# Patient Record
Sex: Male | Born: 2000 | Race: White | Hispanic: No | Marital: Single | Smoking: Former smoker
Health system: Southern US, Community
[De-identification: ages and names within clinical notes are randomized; demographics above are authoritative.]

## PROBLEM LIST (undated history)

## (undated) DIAGNOSIS — E669 Obesity, unspecified: Secondary | ICD-10-CM

## (undated) DIAGNOSIS — S42309A Unspecified fracture of shaft of humerus, unspecified arm, initial encounter for closed fracture: Secondary | ICD-10-CM

## (undated) HISTORY — PX: LUMBAR FUSION: SHX111

## (undated) HISTORY — PX: TYMPANOSTOMY TUBE PLACEMENT: SHX32

## (undated) HISTORY — PX: HEMILAMINOTOMY LUMBAR SPINE: SUR654

## (undated) HISTORY — PX: LUMBAR LAMINECTOMY: SHX95

---
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
## 2018-01-29 ENCOUNTER — Encounter (HOSPITAL_BASED_OUTPATIENT_CLINIC_OR_DEPARTMENT_OTHER): Payer: Self-pay | Admitting: *Deleted

## 2018-01-29 ENCOUNTER — Other Ambulatory Visit: Payer: Self-pay

## 2018-01-29 DIAGNOSIS — R21 Rash and other nonspecific skin eruption: Secondary | ICD-10-CM | POA: Insufficient documentation

## 2018-01-29 DIAGNOSIS — L500 Allergic urticaria: Secondary | ICD-10-CM | POA: Diagnosis not present

## 2018-01-29 DIAGNOSIS — T7840XA Allergy, unspecified, initial encounter: Secondary | ICD-10-CM | POA: Insufficient documentation

## 2018-01-29 DIAGNOSIS — K130 Diseases of lips: Secondary | ICD-10-CM | POA: Diagnosis not present

## 2018-01-29 DIAGNOSIS — R600 Localized edema: Secondary | ICD-10-CM | POA: Diagnosis not present

## 2018-01-29 DIAGNOSIS — F17200 Nicotine dependence, unspecified, uncomplicated: Secondary | ICD-10-CM | POA: Insufficient documentation

## 2018-01-29 NOTE — ED Triage Notes (Signed)
Upper lip has been swelling. Started after eating dinner. Slight swelling. No distress.

## 2018-01-30 ENCOUNTER — Emergency Department (HOSPITAL_BASED_OUTPATIENT_CLINIC_OR_DEPARTMENT_OTHER)
Admission: EM | Admit: 2018-01-30 | Discharge: 2018-01-30 | Disposition: A | Discharge status: Home / Self Care | Payer: BLUE CROSS/BLUE SHIELD | Attending: Emergency Medicine | Admitting: Emergency Medicine

## 2018-01-30 DIAGNOSIS — T7840XA Allergy, unspecified, initial encounter: Secondary | ICD-10-CM

## 2018-01-30 MED ORDER — PREDNISONE 10 MG PO TABS: 20.0000 mg | ORAL_TABLET | Freq: Two times a day (BID) | ORAL | 0 refills | Status: DC

## 2018-01-30 MED ORDER — PREDNISONE 20 MG PO TABS
40.0000 mg | ORAL_TABLET | Freq: Once | ORAL | Status: AC
  Administered 2018-01-30: 40 mg via ORAL
  Filled 2018-01-30: qty 2

## 2018-01-30 MED ORDER — DIPHENHYDRAMINE HCL 25 MG PO CAPS
25.0000 mg | ORAL_CAPSULE | Freq: Once | ORAL | Status: AC
  Administered 2018-01-30: 25 mg via ORAL
  Filled 2018-01-30: qty 1

## 2018-01-30 NOTE — Discharge Instructions (Addendum)
Prednisone as prescribed.  Benadryl 25 mg every six hours for the next three days.  Return to the ER for worsening swelling, difficulty breathing or swallowing, or other new and concerning symptoms.

## 2018-01-30 NOTE — ED Provider Notes (Signed)
[facility] EMERGENCY DEPARTMENT Provider Note   CSN: [id number] Arrival date & time: 01/29/18  2257     History   Chief Complaint Chief Complaint  Patient presents with  . Oral Swelling    HPI Jesse Knox is a 17 y.o. male.  Patient is a 17 year old male with no significant past medical history.  He presents today for evaluation of lip swelling.  After eating dinner this evening, he noticed that his left upper lip began to swell.  He denies any discomfort or any pain.  He denies having consumed any foods at dinner that he has not eaten in the past.  Regarding breathing or swallowing.  He also noticed a rash under his left arm near his axilla that is itchy.  The history is provided by the patient.    Past Medical History:  Diagnosis Date  . Humerus fracture   . Obese     There are no active problems to display for this patient.   Past Surgical History:  Procedure Laterality Date  . HEMILAMINOTOMY LUMBAR SPINE    . LUMBAR FUSION    . LUMBAR LAMINECTOMY    . TYMPANOSTOMY TUBE PLACEMENT          Home Medications    Prior to Admission medications   Medication Sig Start Date End Date Taking? Authorizing Provider  acetaminophen (TYLENOL) 500 MG tablet Take 1,000 mg by mouth every 6 (six) hours as needed. For leg pain    [provider]  cephALEXin (KEFLEX) 500 MG capsule Take 1 capsule (500 mg total) by mouth 4 (four) times daily. 07/26/12   Bonk, Harrington Challenger, MD  doxycycline (VIBRAMYCIN) 100 MG capsule Take 1 capsule (100 mg total) by mouth 2 (two) times daily. 07/26/12   Bonk, Harrington Challenger, MD  ibuprofen (ADVIL,MOTRIN) 100 MG/5ML suspension Take 300 mg by mouth every 6 (six) hours as needed. For pain    [provider]  naproxen sodium (ANAPROX) 220 MG tablet Take 220 mg by mouth 2 (two) times daily with a meal. For pain    [provider]  oseltamivir (TAMIFLU) 30 MG capsule Take 30 mg by mouth 2 (two) times daily.    [provider]  polyethylene glycol powder (GLYCOLAX/MIRALAX) powder Take 17 g by mouth daily as needed. For bowels    [provider]  Psyllium (METAMUCIL PO) Take 15 g by mouth daily as needed. For bowels    [provider]    Family History No family history on file.  Social History Social History   Tobacco Use  . Smoking status: Current Every Day Smoker  . Smokeless tobacco: Current User  Substance Use Topics  . Alcohol use: No  . Drug use: Never     Allergies   Patient has no known allergies.   Review of Systems Review of Systems  All other systems reviewed and are negative.    Physical Exam Updated Vital Signs BP (!) 119/59 (BP Location: Right Arm)   Pulse 62   Temp 98.1 F (36.7 C) (Oral)   Resp 20   Ht 6\' 1"  (1.854 m)   Wt 122.5 kg (270 lb)   SpO2 98%   BMI 35.62 kg/m   Physical Exam  Constitutional: He is oriented to person, place, and time. He appears well-developed and well-nourished. No distress.  HENT:  Head: Normocephalic and atraumatic.  Mouth/Throat: Oropharynx is clear and moist.  There is mild swelling of the left upper lip.  There is  no oral swelling and no stridor.  Neck: Normal range of motion. Neck supple.  Cardiovascular: Normal rate and regular rhythm. Exam reveals no friction rub.  No murmur heard. Pulmonary/Chest: Effort normal and breath sounds normal. No respiratory distress. He has no wheezes. He has no rales.  Abdominal: Soft. Bowel sounds are normal. He exhibits no distension. There is no tenderness.  Musculoskeletal: Normal range of motion. He exhibits no edema.  Neurological: He is alert and oriented to person, place, and time. Coordination normal.  Skin: Skin is warm and dry. He is not diaphoretic.  There is an urticarial rash under the left arm near the axilla.  Nursing note and vitals reviewed.    ED Treatments / Results  Labs (all labs ordered are listed, but only abnormal results are  displayed) Labs Reviewed - No data to display  EKG None  Radiology No results found.  Procedures Procedures (including critical care time)  Medications Ordered in ED Medications  predniSONE (DELTASONE) tablet 40 mg (has no administration in time range)  diphenhydrAMINE (BENADRYL) capsule 25 mg (has no administration in time range)     Initial Impression / Assessment and Plan / ED Course  I have reviewed the triage vital signs and the nursing notes.  Pertinent labs & imaging results that were available during my care of the patient were reviewed by me and considered in my medical decision making (see chart for details).  Patient with what appears to be an allergic reaction, the precipitating factor I am uncertain.  He does work doing Chief Executive Officer in a cemetery and I suspect he may have come into contact with something there.  Either way, he will be treated with prednisone, Benadryl, and follow-up as needed.  The patient's mother is a Marine scientist and will look after him this evening.  Final Clinical Impressions(s) / ED Diagnoses   Final diagnoses:  None    ED Discharge Orders    None       Veryl Speak, MD 01/30/18 [phone]

## 2018-01-30 NOTE — ED Notes (Signed)
Mom reports urticaria on BIL arms. NAD. No increase to swelling of upper lip. Meds given.

## 2018-04-12 ENCOUNTER — Emergency Department (HOSPITAL_COMMUNITY)
Admission: EM | Admit: 2018-04-12 | Discharge: 2018-04-13 | Disposition: A | Discharge status: Home / Self Care | Payer: BLUE CROSS/BLUE SHIELD | Attending: Emergency Medicine | Admitting: Emergency Medicine

## 2018-04-12 DIAGNOSIS — R0602 Shortness of breath: Secondary | ICD-10-CM | POA: Diagnosis not present

## 2018-04-12 DIAGNOSIS — R079 Chest pain, unspecified: Secondary | ICD-10-CM | POA: Diagnosis not present

## 2018-04-12 DIAGNOSIS — R091 Pleurisy: Secondary | ICD-10-CM

## 2018-04-12 DIAGNOSIS — F17200 Nicotine dependence, unspecified, uncomplicated: Secondary | ICD-10-CM | POA: Diagnosis not present

## 2018-04-12 DIAGNOSIS — R0789 Other chest pain: Secondary | ICD-10-CM | POA: Diagnosis not present

## 2018-04-13 ENCOUNTER — Other Ambulatory Visit: Payer: Self-pay

## 2018-04-13 ENCOUNTER — Emergency Department (HOSPITAL_COMMUNITY): Payer: BLUE CROSS/BLUE SHIELD

## 2018-04-13 DIAGNOSIS — R079 Chest pain, unspecified: Secondary | ICD-10-CM | POA: Diagnosis not present

## 2018-04-13 LAB — BASIC METABOLIC PANEL
ANION GAP: 9 (ref 5–15)
BUN: 19 mg/dL (ref 6–20)
CALCIUM: 9.7 mg/dL (ref 8.9–10.3)
CO2: 28 mmol/L (ref 22–32)
Chloride: 102 mmol/L (ref 98–111)
Creatinine, Ser: 1.13 mg/dL (ref 0.61–1.24)
Glucose, Bld: 122 mg/dL — ABNORMAL HIGH (ref 70–99)
POTASSIUM: 3.4 mmol/L — AB (ref 3.5–5.1)
SODIUM: 139 mmol/L (ref 135–145)

## 2018-04-13 LAB — CBC
HEMATOCRIT: 43.6 % (ref 39.0–52.0)
HEMOGLOBIN: 14.4 g/dL (ref 13.0–17.0)
MCH: 27.7 pg (ref 26.0–34.0)
MCHC: 33 g/dL (ref 30.0–36.0)
MCV: 83.8 fL (ref 78.0–100.0)
PLATELETS: 265 10*3/uL (ref 150–400)
RBC: 5.2 MIL/uL (ref 4.22–5.81)
RDW: 12.9 % (ref 11.5–15.5)
WBC: 9.2 10*3/uL (ref 4.0–10.5)

## 2018-04-13 LAB — I-STAT TROPONIN, ED: TROPONIN I, POC: 0 ng/mL (ref 0.00–0.08)

## 2018-04-13 LAB — D-DIMER, QUANTITATIVE: D-Dimer, Quant: <0.27 ug/mL-FEU (ref 0.00–0.50)

## 2018-04-13 NOTE — ED Notes (Signed)
As phlebotomist placed tourniquet on patient, he became pale and diaphoretic. After being stuck with needle patient then vomited and remained pale for several minutes.

## 2018-04-13 NOTE — ED Provider Notes (Signed)
[facility] EMERGENCY DEPARTMENT Provider Note   CSN: [id number] Arrival date & time: 04/12/18  2354     History   Chief Complaint Chief Complaint  Patient presents with  . Chest Pain    HPI Jesse Knox is a 17 y.o. male.  The history is provided by the patient and a parent.  Chest Pain   This is a new problem. The current episode started 2 days ago. The problem occurs constantly. The problem has not changed since onset.Pain location: Left chest. The pain is moderate. The quality of the pain is described as pleuritic and sharp. The pain does not radiate. Associated symptoms include shortness of breath. Pertinent negatives include no abdominal pain, no back pain, no fever, no hemoptysis, no lower extremity edema, no syncope and no weakness. He has tried nothing for the symptoms.  Pertinent negatives for past medical history include no CAD and no PE.  Pertinent negatives for family medical history include: no CAD.  Patient presents for chest pain.  He reports several days ago he began having neck pain, that progressed to shoulder pain.  He now reports all his pain is in his chest.  It is mostly with breathing and some movement.  He reports the pain is sharp He has no known history of CAD/PE.  He does do exertional activity with work, so he may have strained his chest.  Denies any recent fatigue or syncope.  He does report during  his blood drawn today in the ER he became nauseous and vomited  Past Medical History:  Diagnosis Date  . Humerus fracture   . Obese     There are no active problems to display for this patient.   Past Surgical History:  Procedure Laterality Date  . HEMILAMINOTOMY LUMBAR SPINE    . LUMBAR FUSION    . LUMBAR LAMINECTOMY    . TYMPANOSTOMY TUBE PLACEMENT          Home Medications    Prior to Admission medications   Medication Sig Start Date End Date Taking? Authorizing Provider  acetaminophen (TYLENOL) 500 MG tablet Take 1,000  mg by mouth every 6 (six) hours as needed. For leg pain    [provider]  Psyllium (METAMUCIL PO) Take 15 g by mouth daily as needed. For bowels    [provider]    Family History No family history on file.  Social History Social History   Tobacco Use  . Smoking status: Current Every Day Smoker  . Smokeless tobacco: Current User  Substance Use Topics  . Alcohol use: No  . Drug use: Never     Allergies   Patient has no known allergies.   Review of Systems Review of Systems  Constitutional: Negative for fatigue and fever.  Respiratory: Positive for shortness of breath. Negative for hemoptysis.   Cardiovascular: Positive for chest pain. Negative for syncope.  Gastrointestinal: Negative for abdominal pain.  Musculoskeletal: Negative for back pain.  Neurological: Negative for syncope and weakness.  All other systems reviewed and are negative.    Physical Exam Updated Vital Signs BP (!) 122/59   Pulse (!) 50   Temp 98.9 F (37.2 C) (Oral)   Resp (!) 21   Ht 1.829 m (6')   Wt 117.9 kg   SpO2 97%   BMI 35.26 kg/m   Physical Exam  CONSTITUTIONAL: Well developed/well nourished HEAD: Normocephalic/atraumatic EYES: EOMI/PERRL ENMT: Mucous membranes moist NECK: supple no meningeal signs SPINE/BACK:entire spine nontender CV:  S1/S2 noted, no murmurs/rubs/gallops noted LUNGS: Lungs are clear to auscultation bilaterally, no apparent distress Chest-no tenderness noted ABDOMEN: soft, nontender, no rebound or guarding, bowel sounds noted throughout abdomen GU:no cva tenderness NEURO: Pt is awake/alert/appropriate, moves all extremitiesx4.  No facial droop.   EXTREMITIES: pulses normal/equal, full ROM, no lower extremity edema or tenderness SKIN: warm, color normal PSYCH: no abnormalities of mood noted, alert and oriented to situation  ED Treatments / Results  Labs (all labs ordered are listed, but only abnormal results are displayed) Labs  Reviewed  BASIC METABOLIC PANEL - Abnormal; Notable for the following components:      Result Value   Potassium 3.4 (*)    Glucose, Bld 122 (*)    All other components within normal limits  CBC  D-DIMER, QUANTITATIVE (NOT AT [facility])  I-STAT TROPONIN, ED    EKG EKG Interpretation  Date/Time:  Friday April 13 2018 00:15:25 EDT Ventricular Rate:  54 PR Interval:  148 QRS Duration: 92 QT Interval:  404 QTC Calculation: 383 R Axis:   49 Text Interpretation:  Sinus bradycardia Otherwise normal ECG No previous ECGs available Confirmed by Ripley Fraise [phone]) on 04/13/2018 3:22:18 AM   Radiology Dg Chest 2 View  Result Date: 04/13/2018 CLINICAL DATA:  17 year old male with chest pain. EXAM: CHEST - 2 VIEW COMPARISON:  None FINDINGS: The heart size and mediastinal contours are within normal limits. Both lungs are clear. The visualized skeletal structures are unremarkable. IMPRESSION: No active cardiopulmonary disease. Electronically Signed   By: Anner Crete M.D.   On: 04/13/2018 00:52    Procedures Procedures Medications Ordered in ED Medications - No data to display   Initial Impression / Assessment and Plan / ED Course  I have reviewed the triage vital signs and the nursing notes.  Pertinent labs & imaging results that were available during my care of the patient were reviewed by me and considered in my medical decision making (see chart for details).     Patient presented with episodes of chest pain.  Low suspicion for ACS.  D-dimer was negative.  Chest x-ray was negative. Of note he did have episodes of bradycardia, and I reviewed on telemetry monitor.  HR ropped to 39-40 and immediately corrected  It always remained sinus.  He was asymptomatic during these episodes.  I have referred him to cardiology, but I do not feel this is an acute issue at this time.  Final Clinical Impressions(s) / ED Diagnoses   Final diagnoses:  Pleurisy    ED Discharge Orders    None         Ripley Fraise, MD 04/13/18 [phone]

## 2018-04-13 NOTE — ED Triage Notes (Addendum)
Patient c/o CP that started in his neck, then down to shoulders and then into his chest. This began 2 days ago. States that pain is worse with movement. 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

## 2018-04-13 NOTE — ED Notes (Signed)
Went to check on patient when xray completed; skin warm and dry and color normal. States that he feels better.

## 2018-04-18 ENCOUNTER — Other Ambulatory Visit (HOSPITAL_COMMUNITY): Payer: BLUE CROSS/BLUE SHIELD

## 2018-04-18 ENCOUNTER — Encounter: Payer: Self-pay | Admitting: Cardiology

## 2018-04-18 ENCOUNTER — Ambulatory Visit: Payer: BLUE CROSS/BLUE SHIELD | Admitting: Cardiology

## 2018-04-18 VITALS — BP 118/68 | HR 48 | Ht 72.0 in | Wt 264.0 lb

## 2018-04-18 DIAGNOSIS — E663 Overweight: Secondary | ICD-10-CM

## 2018-04-18 DIAGNOSIS — R001 Bradycardia, unspecified: Secondary | ICD-10-CM | POA: Diagnosis not present

## 2018-04-18 DIAGNOSIS — R079 Chest pain, unspecified: Secondary | ICD-10-CM | POA: Insufficient documentation

## 2018-04-18 DIAGNOSIS — F17210 Nicotine dependence, cigarettes, uncomplicated: Secondary | ICD-10-CM | POA: Insufficient documentation

## 2018-04-18 NOTE — Patient Instructions (Signed)
Medication Instructions:  Your physician recommends that you continue on your current medications as directed. Please refer to the Current Medication list given to you today.  Labwork: Your physician recommends that you have the following labs drawn: BMP, TSH, and Lipid panel.  Testing/Procedures: Your physician has requested that you have an echocardiogram. Echocardiography is a painless test that uses sound waves to create images of your heart. It provides your doctor with information about the size and shape of your heart and how well your heart's chambers and valves are working. This procedure takes approximately one hour. There are no restrictions for this procedure.  Your physician has recommended that you wear a holter monitor. Holter monitors are medical devices that record the heart's electrical activity. Doctors most often use these monitors to diagnose arrhythmias. Arrhythmias are problems with the speed or rhythm of the heartbeat. The monitor is a small, portable device. You can wear one while you do your normal daily activities. This is usually used to diagnose what is causing palpitations/syncope (passing out).  Your physician has requested that you have an exercise tolerance test. For further information please visit [email or web address]. Please also follow instruction sheet, as given.  Follow-Up: Your physician recommends that you schedule a follow-up appointment in: 6 months  Any Other Special Instructions Will Be Listed Below (If Applicable).     If you need a refill on your cardiac medications before your next appointment, please call your pharmacy.   [place], RN, BSN   Cardiopulmonary Exercise Stress Test Cardiopulmonary exercise testing (CPET) is a test that checks how your heart and lungs react to exercise. This is called your exercise capacity. During this test, you will walk or run on a treadmill or pedal on a stationary bike while tests are done  on your heart and lungs. You may have this test to:  See why you are short of breath.  Check for exercise intolerance.  See how your lungs work.  See how your heart works.  Check for how you are responding to a heart or lung rehabilitation program.  See if you have a heart or lung problem.  See if you are healthy enough to have surgery.  What happens before the procedure?  Follow instructions from your doctor about what you cannot eat or drink.  Ask your doctor about changing or stopping your normal medicines. This is important if you take diabetes medicines or blood thinners.  Wear loose, comfortable clothing and shoes.  If you use an inhaler, bring it with you to the test. What happens during the procedure?  A blood pressure cuff will be placed on your arm.  Several stick-on patches (electrodes) will be placed on your chest. They will be attached to an electrocardiogram (EKG) machine.  A clip-on monitor that measures the amount of oxygen in your blood will be placed on your finger (pulse oximeter).  A clip will be placed on your nose and a mouthpiece will be placed in your mouth. This may be held in place with a headpiece. You will breathe through the mouthpiece during the test.  You will be asked to start exercising. You will be closely watched while you exercise.  The amount of effort for your exercise will be gradually increased.  During exercise, the test will measure: ? Your heart rate. ? Your heart rhythm. ? Your oxygen blood level. ? The amount of oxygen and carbon dioxide that you breathe out.  The test will end when: ?  You have finished the test. ? You have reached your maximum ability to exercise. ? You have chest or leg pain, dizziness, or shortness of breath. The procedure may vary among doctors and hospitals. What happens after the procedure?  Your blood pressure and EKG will be checked to watch how you recover from the test. This information is not  intended to replace advice given to you by your health care provider. Make sure you discuss any questions you have with your health care provider. Document Released: 07/20/2009 Document Revised: 12/22/2015 Document Reviewed: 06/15/2015 Elsevier Interactive Patient Education  2018 [employer].  Holter Monitoring A Holter monitor is a small device that is used to detect abnormal heart rhythms. It clips to your clothing and is connected by wires to flat, sticky disks (electrodes) that attach to your chest. It is worn continuously for 24-48 hours. Follow these instructions at home:  Wear your Holter monitor at all times, even while exercising and sleeping, for as long as directed by your health care provider.  Make sure that the Holter monitor is safely clipped to your clothing or close to your body as recommended by your health care provider.  Do not get the monitor or wires wet.  Do not put body lotion or moisturizer on your chest.  Keep your skin clean.  Keep a diary of your daily activities, such as walking and doing chores. If you feel that your heartbeat is abnormal or that your heart is fluttering or skipping a beat: ? Record what you are doing when it happens. ? Record what time of day the symptoms occur.  Return your Holter monitor as directed by your health care provider.  Keep all follow-up visits as directed by your health care provider. This is important. Get help right away if:  You feel lightheaded or you faint.  You have trouble breathing.  You feel pain in your chest, upper arm, or jaw.  You feel sick to your stomach and your skin is pale, cool, or damp.  You heartbeat feels unusual or abnormal. This information is not intended to replace advice given to you by your health care provider. Make sure you discuss any questions you have with your health care provider. Document Released: 04/29/2004 Document Revised: 01/07/2016 Document Reviewed: 03/10/2014 Elsevier  Interactive Patient Education  2018 [employer]. Echocardiogram An echocardiogram, or echocardiography, uses sound waves (ultrasound) to produce an image of your heart. The echocardiogram is simple, painless, obtained within a short period of time, and offers valuable information to your health care provider. The images from an echocardiogram can provide information such as:  Evidence of coronary artery disease (CAD).  Heart size.  Heart muscle function.  Heart valve function.  Aneurysm detection.  Evidence of a past heart attack.  Fluid buildup around the heart.  Heart muscle thickening.  Assess heart valve function.  Tell a health care provider about:  Any allergies you have.  All medicines you are taking, including vitamins, herbs, eye drops, creams, and over-the-counter medicines.  Any problems you or family members have had with anesthetic medicines.  Any blood disorders you have.  Any surgeries you have had.  Any medical conditions you have.  Whether you are pregnant or may be pregnant. What happens before the procedure? No special preparation is needed. Eat and drink normally. What happens during the procedure?  In order to produce an image of your heart, gel will be applied to your chest and a wand-like tool (transducer) will be moved  over your chest. The gel will help transmit the sound waves from the transducer. The sound waves will harmlessly bounce off your heart to allow the heart images to be captured in real-time motion. These images will then be recorded.  You may need an IV to receive a medicine that improves the quality of the pictures. What happens after the procedure? You may return to your normal schedule including diet, activities, and medicines, unless your health care provider tells you otherwise. This information is not intended to replace advice given to you by your health care provider. Make sure you discuss any questions you have with your  health care provider. Document Released: 07/29/2000 Document Revised: 03/19/2016 Document Reviewed: 04/08/2013 Elsevier Interactive Patient Education  2017 [employer].

## 2018-04-18 NOTE — Progress Notes (Signed)
Cardiology Office Note:    Date:  04/18/2018   ID:  Jesse Knox, DOB 14-Dec-2000, MRN [id number]  PCP:  Patient, No Pcp Per  Cardiologist:  Jenean Lindau, MD   Referring MD: Ripley Fraise, MD    ASSESSMENT:    1. Chest pain, unspecified type   2. Bradycardia   3. Cigarette smoker   4. Overweight    PLAN:    In order of problems listed above:  1. I discussed my findings with the patient at extensive length and reassured him and his mother.  Questions were answered to their satisfaction.  I reviewed [place] hospital records extensively. 2. His blood pressure is stable.  His heart rate is on the lower side and I will do a TSH to evaluate this and a 48-hour Holter monitor.  I will also get him a treadmill stress test to assess his chest pain and this will also help me assess his chronotropic competence. 3. His mother requests and also the patient does want me to check his lipids for risk stratification and because of his poor diet and we will oblige.  He will also have a Chem-7 because he has had a Chem-7 recently with low potassium. 4. Echocardiogram will be done to assess murmur heard on auscultation. 5. Patient will be seen in follow-up appointment in 6 months or earlier if the patient has any concerns 6. He knows to go to the nearest emergency room for any concerning symptoms. 7. I spent 5 minutes with [name] discussing the risks of smoking. The patient vocalized understanding and plans to quit. I offered the patient, pharmacological interventions however the patient plans to initially try to quit by the patient's own efforts. Patient will get in touch with me if any pharmacological aid/prescriptions are felt necessary.   Medication Adjustments/Labs and Tests Ordered: Current medicines are reviewed at length with the patient today.  Concerns regarding medicines are outlined above.  No orders of the defined types were placed in this encounter.  No orders of the defined  types were placed in this encounter.    History of Present Illness:    Jesse Knox is a 17 y.o. male who is being seen today for the evaluation of chest pain at the request of Ripley Fraise, MD.  Patient is a pleasant 17 year old gentleman.  He is accompanied by his mother today.  Patient has history of multiple issues to his spine and 2 spinal surgeries.  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  The patient had an episode of chest tightness and did not feel well and went to the emergency room.  In the emergency room he was evaluated.  An EKG and blood work was unremarkable and he was sent here.  He mentions to me that subsequently has gone back to work without any symptoms.  His mother is a Marine scientist and very concerned about it the symptoms for obvious reasons.  At the time of my evaluation, the patient is alert awake oriented and in no distress.  He leads a very sedentary lifestyle.  Unfortunately he smokes on a regular basis.  Past Medical History:  Diagnosis Date  . Humerus fracture   . Obese     Past Surgical History:  Procedure Laterality Date  . HEMILAMINOTOMY LUMBAR SPINE    . LUMBAR FUSION    . LUMBAR LAMINECTOMY    . TYMPANOSTOMY TUBE PLACEMENT  Current Medications: No outpatient medications have been marked as taking for the 04/18/18 encounter (Office Visit) with Revankar, Reita Cliche, MD.     Allergies:   Patient has no known allergies.   Social History   Socioeconomic History  . Marital status: Single    Spouse name: Not on file  . Number of children: Not on file  . Years of education: Not on file  . Highest education level: Not on file  Occupational History  . Not on file  Social Needs  . Financial resource strain: Not on file  . Food insecurity:    Worry: Not on file    Inability: Not on file  . Transportation needs:    Medical: Not on file    Non-medical: Not on file  Tobacco Use  . Smoking status: Current  Every Day Smoker  . Smokeless tobacco: Current User  Substance and Sexual Activity  . Alcohol use: No  . Drug use: Never  . Sexual activity: Not on file  Lifestyle  . Physical activity:    Days per week: Not on file    Minutes per session: Not on file  . Stress: Not on file  Relationships  . Social connections:    Talks on phone: Not on file    Gets together: Not on file    Attends religious service: Not on file    Active member of club or organization: Not on file    Attends meetings of clubs or organizations: Not on file    Relationship status: Not on file  Other Topics Concern  . Not on file  Social History Narrative  . Not on file     Family History: The patient's family history includes Heart attack in his maternal uncle.  ROS:   Please see the history of present illness.    All other systems reviewed and are negative.  EKGs/Labs/Other Studies Reviewed:    The following studies were reviewed today: EKG reveals sinus rhythm and nonspecific ST-T changes.   Recent Labs: 04/13/2018: BUN 19; Creatinine, Ser 1.13; Hemoglobin 14.4; Platelets 265; Potassium 3.4; Sodium 139  Recent Lipid Panel No results found for: CHOL, TRIG, HDL, CHOLHDL, VLDL, LDLCALC, LDLDIRECT  Physical Exam:    VS:  BP 118/68 (BP Location: Right Arm, Patient Position: Sitting, Cuff Size: Normal)   Pulse (!) 48   Ht 6' (1.829 m)   Wt 264 lb (119.7 kg)   SpO2 97%   BMI 35.80 kg/m     Wt Readings from Last 3 Encounters:  04/18/18 264 lb (119.7 kg) (>99 %, Z= 2.62)*  04/13/18 260 lb (117.9 kg) (>99 %, Z= 2.56)*  01/29/18 270 lb (122.5 kg) (>99 %, Z= 2.70)*   * Growth percentiles are based on CDC (Boys, 2-20 Years) data.     GEN: Patient is in no acute distress HEENT: Normal NECK: No JVD; No carotid bruits LYMPHATICS: No lymphadenopathy CARDIAC: S1 S2 regular, 2/6 systolic murmur at the apex. RESPIRATORY:  Clear to auscultation without rales, wheezing or rhonchi  ABDOMEN: Soft,  non-tender, non-distended MUSCULOSKELETAL:  No edema; No deformity  SKIN: Warm and dry NEUROLOGIC:  Alert and oriented x 3 PSYCHIATRIC:  Normal affect    Signed, Jenean Lindau, MD  04/18/2018 9:44 AM    [place]

## 2018-04-19 LAB — BASIC METABOLIC PANEL
BUN/Creatinine Ratio: 15 (ref 9–20)
BUN: 19 mg/dL (ref 6–20)
CALCIUM: 9.4 mg/dL (ref 8.7–10.2)
CO2: 22 mmol/L (ref 20–29)
Chloride: 105 mmol/L (ref 96–106)
Creatinine, Ser: 1.25 mg/dL (ref 0.76–1.27)
GFR calc Af Amer: 96 mL/min/{1.73_m2} (ref >59)
GFR, EST NON AFRICAN AMERICAN: 83 mL/min/{1.73_m2} (ref >59)
GLUCOSE: 92 mg/dL (ref 65–99)
POTASSIUM: 4.1 mmol/L (ref 3.5–5.2)
SODIUM: 144 mmol/L (ref 134–144)

## 2018-04-19 LAB — LIPID PANEL
CHOLESTEROL TOTAL: 148 mg/dL (ref 100–169)
Chol/HDL Ratio: 4.1 ratio (ref 0.0–5.0)
HDL: 36 mg/dL — ABNORMAL LOW (ref >39)
LDL CALC: 97 mg/dL (ref 0–109)
TRIGLYCERIDES: 75 mg/dL (ref 0–89)
VLDL Cholesterol Cal: 15 mg/dL (ref 5–40)

## 2018-04-19 LAB — TSH: TSH: 2.46 u[IU]/mL (ref 0.450–4.500)

## 2018-04-24 ENCOUNTER — Ambulatory Visit (INDEPENDENT_AMBULATORY_CARE_PROVIDER_SITE_OTHER): Payer: BLUE CROSS/BLUE SHIELD

## 2018-04-24 ENCOUNTER — Ambulatory Visit (HOSPITAL_COMMUNITY): Payer: BLUE CROSS/BLUE SHIELD | Attending: Cardiology

## 2018-04-24 ENCOUNTER — Other Ambulatory Visit: Payer: Self-pay

## 2018-04-24 DIAGNOSIS — R079 Chest pain, unspecified: Secondary | ICD-10-CM

## 2018-04-24 DIAGNOSIS — F17210 Nicotine dependence, cigarettes, uncomplicated: Secondary | ICD-10-CM

## 2018-04-24 DIAGNOSIS — R001 Bradycardia, unspecified: Secondary | ICD-10-CM

## 2018-04-24 LAB — EXERCISE TOLERANCE TEST
CHL CUP MPHR: 201 {beats}/min
CHL CUP RESTING HR STRESS: 50 {beats}/min
CHL RATE OF PERCEIVED EXERTION: 16
Estimated workload: 12.2 METS
Exercise duration (min): 10 min
Exercise duration (sec): 6 s
Peak HR: 179 {beats}/min
Percent HR: 89 %

## 2018-05-04 ENCOUNTER — Telehealth: Payer: Self-pay | Admitting: Cardiology

## 2018-05-04 NOTE — Telephone Encounter (Signed)
Please call patients mother with results of the testing that was done at [place] .  They have been waiting a while.

## 2018-05-04 NOTE — Telephone Encounter (Signed)
Mother informed of results.

## 2018-05-25 ENCOUNTER — Telehealth: Payer: Self-pay

## 2018-05-25 NOTE — Telephone Encounter (Signed)
-----   Message from Jenean Lindau, MD sent at 05/25/2018  9:42 AM EDT ----- The results of the study is unremarkable. Please inform patient. I will discuss in detail at next appointment. Cc  primary care/referring physician Jenean Lindau, MD 05/25/2018 9:42 AM

## 2018-05-25 NOTE — Telephone Encounter (Signed)
Patient called and notified of test results. 

## 2018-12-22 DIAGNOSIS — Z03818 Encounter for observation for suspected exposure to other biological agents ruled out: Secondary | ICD-10-CM | POA: Diagnosis not present

## 2018-12-22 DIAGNOSIS — B349 Viral infection, unspecified: Secondary | ICD-10-CM | POA: Diagnosis not present

## 2019-01-24 ENCOUNTER — Emergency Department
Admission: EM | Admit: 2019-01-24 | Discharge: 2019-01-24 | Disposition: A | Discharge status: Home / Self Care | Payer: BC Managed Care – PPO | Attending: Emergency Medicine | Admitting: Emergency Medicine

## 2019-01-24 ENCOUNTER — Encounter: Payer: Self-pay | Admitting: Emergency Medicine

## 2019-01-24 ENCOUNTER — Other Ambulatory Visit: Payer: Self-pay

## 2019-01-24 DIAGNOSIS — Y929 Unspecified place or not applicable: Secondary | ICD-10-CM | POA: Diagnosis not present

## 2019-01-24 DIAGNOSIS — F909 Attention-deficit hyperactivity disorder, unspecified type: Secondary | ICD-10-CM | POA: Insufficient documentation

## 2019-01-24 DIAGNOSIS — S0990XA Unspecified injury of head, initial encounter: Secondary | ICD-10-CM

## 2019-01-24 DIAGNOSIS — Y998 Other external cause status: Secondary | ICD-10-CM | POA: Diagnosis not present

## 2019-01-24 DIAGNOSIS — F17200 Nicotine dependence, unspecified, uncomplicated: Secondary | ICD-10-CM | POA: Diagnosis not present

## 2019-01-24 DIAGNOSIS — W228XXA Striking against or struck by other objects, initial encounter: Secondary | ICD-10-CM | POA: Diagnosis not present

## 2019-01-24 DIAGNOSIS — S0101XA Laceration without foreign body of scalp, initial encounter: Secondary | ICD-10-CM

## 2019-01-24 DIAGNOSIS — Y9389 Activity, other specified: Secondary | ICD-10-CM | POA: Insufficient documentation

## 2019-01-24 NOTE — Discharge Instructions (Signed)
Return to the emergency department for any symptom of concern.  Keep the wound dry for the next 5 days.
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
## 2019-05-15 ENCOUNTER — Emergency Department (HOSPITAL_BASED_OUTPATIENT_CLINIC_OR_DEPARTMENT_OTHER)
Admission: EM | Admit: 2019-05-15 | Discharge: 2019-05-15 | Disposition: A | Discharge status: Home / Self Care | Payer: BC Managed Care – PPO | Attending: Emergency Medicine | Admitting: Emergency Medicine

## 2019-05-15 ENCOUNTER — Other Ambulatory Visit: Payer: Self-pay

## 2019-05-15 ENCOUNTER — Emergency Department (HOSPITAL_BASED_OUTPATIENT_CLINIC_OR_DEPARTMENT_OTHER): Payer: BC Managed Care – PPO

## 2019-05-15 ENCOUNTER — Encounter (HOSPITAL_BASED_OUTPATIENT_CLINIC_OR_DEPARTMENT_OTHER): Payer: Self-pay | Admitting: *Deleted

## 2019-05-15 DIAGNOSIS — Y9389 Activity, other specified: Secondary | ICD-10-CM | POA: Diagnosis not present

## 2019-05-15 DIAGNOSIS — Y92008 Other place in unspecified non-institutional (private) residence as the place of occurrence of the external cause: Secondary | ICD-10-CM | POA: Diagnosis not present

## 2019-05-15 DIAGNOSIS — M25562 Pain in left knee: Secondary | ICD-10-CM | POA: Diagnosis not present

## 2019-05-15 DIAGNOSIS — Y999 Unspecified external cause status: Secondary | ICD-10-CM | POA: Diagnosis not present

## 2019-05-15 DIAGNOSIS — S8992XA Unspecified injury of left lower leg, initial encounter: Secondary | ICD-10-CM | POA: Diagnosis not present

## 2019-05-15 DIAGNOSIS — F17290 Nicotine dependence, other tobacco product, uncomplicated: Secondary | ICD-10-CM | POA: Insufficient documentation

## 2019-05-15 DIAGNOSIS — L03311 Cellulitis of abdominal wall: Secondary | ICD-10-CM | POA: Insufficient documentation

## 2019-05-15 DIAGNOSIS — W08XXXA Fall from other furniture, initial encounter: Secondary | ICD-10-CM | POA: Diagnosis not present

## 2019-05-15 MED ORDER — CEPHALEXIN 500 MG PO CAPS: 500.0000 mg | ORAL_CAPSULE | Freq: Four times a day (QID) | ORAL | 0 refills | Status: AC

## 2019-05-15 MED FILL — CEPHALEXIN 500 MG CAPSULE: 500 | 7 days supply | Qty: 28 | Fill #0

## 2019-05-15 NOTE — Discharge Instructions (Signed)
Your history and exam today are consistent with a folliculitis versus cellulitis on her abdomen.  We did not find evidence of an abscess.  Please take the antibiotic she will treat this and keep the area clean and dry.  For the knee, please use the knee brace and crutches and follow-up with orthopedics.  If any symptoms change or worsen, please return to the nearest emergency department.

## 2019-05-15 NOTE — ED Notes (Signed)
ED Provider at bedside. 

## 2019-05-15 NOTE — ED Notes (Signed)
Patient refused crutches.

## 2019-05-15 NOTE — ED Provider Notes (Signed)
[facility] Provider Note   CSN: [id number] Arrival date & time: 05/15/19  [id number]     History   Chief Complaint Chief Complaint  Patient presents with  . Abdominal, knee pain    HPI Jesse Knox is a 18 y.o. male.     The history is provided by the patient, a parent and medical records. No language interpreter was used.  Rash Location:  Torso Torso rash location: central lower abd. Quality: painful   Severity:  Mild Onset quality:  Gradual Timing:  Constant Progression:  Unchanged Chronicity:  Recurrent Relieved by:  Nothing Worsened by:  Nothing Ineffective treatments:  None tried Associated symptoms: no abdominal pain, no diarrhea, no fatigue, no fever, no headaches, no nausea, no shortness of breath, no sore throat, not vomiting and not wheezing   Knee Pain Location:  Knee Time since incident:  2 weeks Injury: yes   Mechanism of injury: fall   Knee location:  L knee Pain details:    Quality:  Aching   Radiates to:  Does not radiate Chronicity:  New Prior injury to area:  No Relieved by:  Nothing Associated symptoms: muscle weakness, numbness and tingling   Associated symptoms: no back pain, no fatigue, no fever and no neck pain   Associated symptoms comment:  Intermittently   Past Medical History:  Diagnosis Date  . Humerus fracture   . Obese     Patient Active Problem List   Diagnosis Date Noted  . Chest pain 04/18/2018  . Bradycardia 04/18/2018  . Cigarette smoker 04/18/2018  . 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 Closed supracondylar fracture of humerus 07/04/2012  . 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 Low back pain 02/29/2012  . Right leg pain 02/29/2012  . 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 Fracture of humerus 07/22/2011  . Attention deficit hyperactivity disorder (ADHD) 06/09/2009  . Encopresis 06/09/2009  . Overweight  07/22/2008    Past Surgical History:  Procedure Laterality Date  . HEMILAMINOTOMY LUMBAR SPINE    . LUMBAR FUSION    . LUMBAR LAMINECTOMY    . TYMPANOSTOMY TUBE PLACEMENT          Home Medications    Prior to Admission medications   Not on File    Family History Family History  Problem Relation Age of Onset  . Heart attack Maternal Uncle     Social History Social History   Tobacco Use  . Smoking status: Current Every Day Smoker  . Smokeless tobacco: Current User  Substance Use Topics  . Alcohol use: No  . Drug use: Never     Allergies   Patient has no known allergies.   Review of Systems Review of Systems  Constitutional: Negative for appetite change, chills, diaphoresis, fatigue and fever.  HENT: Negative for congestion and sore throat.   Respiratory: Negative for cough, chest tightness, shortness of breath and wheezing.   Cardiovascular: Negative for chest pain, palpitations and leg swelling.  Gastrointestinal: Negative for abdominal pain, constipation, diarrhea, nausea and vomiting.  Genitourinary: Negative for flank pain and frequency.  Musculoskeletal: Negative for back pain, neck pain and neck stiffness.  Skin: Positive for rash. Negative for wound.  Neurological: Negative for light-headedness and headaches.  Psychiatric/Behavioral: Negative for agitation.  All other systems reviewed and are negative.    Physical Exam Updated Vital Signs BP 125/65 (BP Location: Right Arm)  Pulse (!) 50   Temp 98 F (36.7 C) (Oral)   Resp 18   Ht 6\' 1"  (1.854 m)   Wt 120.2 kg   SpO2 98%   BMI 34.96 kg/m   Physical Exam Vitals signs and nursing note reviewed.  Constitutional:      General: He is not in acute distress.    Appearance: He is well-developed. He is not ill-appearing, toxic-appearing or diaphoretic.  HENT:     Head: Normocephalic and atraumatic.     Nose: Nose normal.  Eyes:     Conjunctiva/sclera: Conjunctivae normal.     Pupils: Pupils  are equal, round, and reactive to light.  Neck:     Musculoskeletal: Neck supple. No muscular tenderness.  Cardiovascular:     Rate and Rhythm: Normal rate and regular rhythm.     Pulses: Normal pulses.     Heart sounds: No murmur.  Pulmonary:     Effort: Pulmonary effort is normal. No respiratory distress.     Breath sounds: Normal breath sounds.  Abdominal:     General: Abdomen is flat. There is no distension.     Palpations: Abdomen is soft.     Tenderness: There is no abdominal tenderness. There is no right CVA tenderness, left CVA tenderness or guarding.     Hernia: No hernia is present.    Musculoskeletal:     Left knee: He exhibits no swelling and no effusion. Tenderness found.     Right lower leg: No edema.     Left lower leg: No edema.       Legs:  Skin:    General: Skin is warm and dry.     Capillary Refill: Capillary refill takes less than 2 seconds.     Findings: Erythema present. No rash.  Neurological:     General: No focal deficit present.     Mental Status: He is alert.     Sensory: No sensory deficit.     Motor: No weakness.  Psychiatric:        Mood and Affect: Mood normal.      ED Treatments / Results  Labs (all labs ordered are listed, but only abnormal results are displayed) Labs Reviewed - No data to display  EKG None  Radiology Dg Knee Complete 4 Views Left  Result Date: 05/15/2019 CLINICAL DATA:  Fall left knee pain. EXAM: LEFT KNEE - COMPLETE 4+ VIEW COMPARISON:  None. FINDINGS: No evidence of fracture, dislocation, or joint effusion. No evidence of arthropathy or other focal bone abnormality. Soft tissues are unremarkable. IMPRESSION: Negative. Electronically Signed   By: Zetta Bills M.D.   On: 05/15/2019 11:50    Procedures Procedures (including critical care time)  Medications Ordered in ED Medications - No data to display   Initial Impression / Assessment and Plan / ED Course  I have reviewed the triage vital signs and the  nursing notes.  Pertinent labs & imaging results that were available during my care of the patient were reviewed by me and considered in my medical decision making (see chart for details).        Jesse Knox is a 18 y.o. male with a past medical history significant for prior humerus fracture and bone grafting, ADHD, and prior lumbar laminectomy who presents with left knee pain with occasional numbness and weakness, and abdominal bump.  Patient reports that for the last 2 weeks he has had pain in his left knee with occasional weakness and numbness distal to  the knee.  He reports that he fell asleep on the couch with his legs crossed and then waking up, he stood up and had pain causing him to have a fall.  He reports he has had intermittent numbness and weakness from the knee down but is nothing constant.  He reports the pain is moderate and constant.  He has no history of knee injury, knee fracture, or ligamentous injury in the past.  He denies numbness or weakness at this time.  He reports no low back pain at this time.  No chest pain, upper extremity pains, headache, or neck pain.  He reports that he is having some redness and a small bump on his belt line in the middle.  He denies history folliculitis or hernias.  On exam, patient's abdomen is diffusely nontender.  At the patient's pubic hair line, there is evidence of folliculitis in 2 areas with 1 small not below the surface.  No evidence of large abscess.  It is mobile.  Differentials lymph node versus small folliculitis.  Do not feel this is a hernia and it is far away from the umbilicus.  It is not pulsatile.  Minimal tenderness.  Groin otherwise had no hernias and no lymph nodes.  Leg exam had tenderness in the left knee and left posterior knee.  Normal sensation, strength, and pulses.  No hip tenderness.  Had a shared decision made conversation with patient and mother.  Clinically aspect patient injured his left knee when he stood up and had  his fall after it fell asleep from his nap.  Also discussed the possibility of a Saturday night type palsy pathology in the leg although this was felt to be less likely.  Compressive nerve palsy would likely be more constant and less intermittent.  We agreed to get x-ray of the knee and placed into a knee immobilizer for orthopedic follow-up.  We agreed to provide antibiotics for the possible cellulitis versus folliculitis of the abdomen.  Anticipate discharge after x-ray.  12:12 PM X-rays reassuring.  We discussed using the brace and crutches and following with orthopedics for further evaluation management of possible ligamentous injury versus nerve injury.  We also will prescribe prescription for Keflex for likely cellulitis versus folliculitis on the abdomen.  Patient encouraged to keep area clean and watch for worsening infection.  Patient and family agree with plan of care and patient discharged in good condition.     Final Clinical Impressions(s) / ED Diagnoses   Final diagnoses:  Injury of left knee, initial encounter  Cellulitis of abdominal wall    ED Discharge Orders         Ordered    cephALEXin (KEFLEX) 500 MG capsule  4 times daily     05/15/19 1214          Clinical Impression: 1. Injury of left knee, initial encounter   2. Cellulitis of abdominal wall     Disposition: Discharge  Condition: Good  I have discussed the results, Dx and Tx plan with the pt(& family if present). He/she/they expressed understanding and agree(s) with the plan. Discharge instructions discussed at great length. Strict return precautions discussed and pt &/or family have verbalized understanding of the instructions. No further questions at time of discharge.    New Prescriptions   CEPHALEXIN (KEFLEX) 500 MG CAPSULE    Take 1 capsule (500 mg total) by mouth 4 (four) times daily for 7 days.    Follow Up: Cornell Barman, MD [address] [place] [address]  [facility] [address] [id number] [place] [phone]    Hiram Gash, MD [address] [place] [address] [phone]        Tegeler, Gwenyth Allegra, MD 05/15/19 1215

## 2019-05-15 NOTE — ED Triage Notes (Signed)
Patient noted a marble size "mass" under his belly button last night.  Unable to straighten his left leg for 2 weeks.

## 2019-06-06 ENCOUNTER — Ambulatory Visit: Payer: BC Managed Care – PPO | Admitting: Family Medicine

## 2019-06-21 ENCOUNTER — Encounter: Payer: Self-pay | Admitting: Family Medicine

## 2019-06-21 ENCOUNTER — Ambulatory Visit: Payer: BC Managed Care – PPO | Admitting: Family Medicine

## 2019-06-21 ENCOUNTER — Other Ambulatory Visit: Payer: Self-pay

## 2019-06-21 VITALS — BP 122/84 | HR 94 | Temp 97.7°F | Ht 70.0 in | Wt 265.0 lb

## 2019-06-21 DIAGNOSIS — Z6838 Body mass index (BMI) 38.0-38.9, adult: Secondary | ICD-10-CM | POA: Diagnosis not present

## 2019-06-21 DIAGNOSIS — E6609 Other obesity due to excess calories: Secondary | ICD-10-CM | POA: Diagnosis not present

## 2019-06-21 DIAGNOSIS — Z7689 Persons encountering health services in other specified circumstances: Secondary | ICD-10-CM

## 2019-06-21 LAB — HEMOGLOBIN A1C: Hgb A1c MFr Bld: 5.4 % (ref 4.6–6.5)

## 2019-06-21 LAB — COMPREHENSIVE METABOLIC PANEL
ALT: 25 U/L (ref 0–53)
AST: 21 U/L (ref 0–37)
Albumin: 4.6 g/dL (ref 3.5–5.2)
Alkaline Phosphatase: 58 U/L (ref 39–117)
BUN: 21 mg/dL (ref 6–23)
CO2: 27 mEq/L (ref 19–32)
Calcium: 9.9 mg/dL (ref 8.4–10.5)
Chloride: 103 mEq/L (ref 96–112)
Creatinine, Ser: 1.09 mg/dL (ref 0.40–1.50)
GFR: 85.79 mL/min (ref >60.00)
Glucose, Bld: 89 mg/dL (ref 70–99)
Potassium: 3.8 mEq/L (ref 3.5–5.1)
Sodium: 141 mEq/L (ref 135–145)
Total Bilirubin: 0.8 mg/dL (ref 0.2–1.2)
Total Protein: 7.3 g/dL (ref 6.0–8.3)

## 2019-06-21 LAB — LIPID PANEL
Cholesterol: 195 mg/dL (ref 0–200)
HDL: 41.7 mg/dL (ref >39.00)
NonHDL: 153.04
Total CHOL/HDL Ratio: 5
Triglycerides: 216 mg/dL — ABNORMAL HIGH (ref 0.0–149.0)
VLDL: 43.2 mg/dL — ABNORMAL HIGH (ref 0.0–40.0)

## 2019-06-21 LAB — LDL CHOLESTEROL, DIRECT: Direct LDL: 127 mg/dL

## 2019-06-21 NOTE — Progress Notes (Signed)
   Subjective:    Patient ID: Jesse Knox, male    DOB: 2000-11-02, 18 y.o.   MRN: [id number]  HPI This is a 18 yo male who presents today to establish care. Currently unemployed, looking for work. Lives with his mother. Enjoys fishing, hunting.   Last CPE- many years Tdap- 03/31/2010 Flu- declines Dental- 2 months ago Eye- never Exercise- no regular Diet- eats once a day, usually fast food. Soda 2-3 times a week, sweet tea daily. Eats a variety of foods.  Sex- male, 1 lifetime partner, declines std testing  ROS- no headaches, visual changes, cough, SOB, abdominal pain, diarrhea/constipation, muscle or joint pain.     Past Medical History:  Diagnosis Date  . Humerus fracture   . Obese    Past Surgical History:  Procedure Laterality Date  . HEMILAMINOTOMY LUMBAR SPINE    . LUMBAR FUSION    . LUMBAR LAMINECTOMY    . TYMPANOSTOMY TUBE PLACEMENT     Family History  Problem Relation Age of Onset  . Heart attack Maternal Uncle   . Breast cancer Maternal Grandmother    Social History   Tobacco Use  . Smoking status: Never Smoker  . Smokeless tobacco: Current User  Substance Use Topics  . Alcohol use: No  . Drug use: Never      Review of Systems Per HPI    Objective:   Physical Exam Physical Exam  Constitutional: Oriented to person, place, and time. He appears well-developed and well-nourished.  HENT:  Head: Normocephalic and atraumatic.  Eyes: Conjunctivae are normal.  Neck: Normal range of motion. Neck supple.  Cardiovascular: Normal rate, regular rhythm and normal heart sounds.   Pulmonary/Chest: Effort normal and breath sounds normal.  Musculoskeletal: Normal range of motion.  Neurological: Alert and oriented to person, place, and time.  Skin: Skin is warm and dry.  Psychiatric: Normal mood and affect. Behavior is normal. Judgment and thought content normal.  Vitals reviewed.   BP 122/84   Pulse 94   Temp 97.7 F (36.5 C) (Temporal)   Ht 5\' 10"   (1.778 m)   Wt 265 lb (120.2 kg)   SpO2 97%   BMI 38.02 kg/m  Wt Readings from Last 3 Encounters:  06/21/19 265 lb (120.2 kg)  05/15/19 265 lb (120.2 kg)  01/24/19 259 lb (117.5 kg)   Depression screen PHQ 2/9 06/21/2019  Decreased Interest 0  Down, Depressed, Hopeless 0  PHQ - 2 Score 0  Altered sleeping 0  Tired, decreased energy 0  Change in appetite 0  Feeling bad or failure about yourself  0  Trouble concentrating 0  Moving slowly or fidgety/restless 0  Suicidal thoughts 0  PHQ-9 Score 0       Assessment & Plan:  1. Encounter to establish care - reviewed EMR, immunizations. Offered flu, HPV- patient declined  2. Class 2 obesity due to excess calories without serious comorbidity with body mass index (BMI) of 38.0 to 38.9 in adult - discussed diet and encouraged decreased consumption of sugary beverages and fast food, encouraged increased vegetables, lean proteins - Lipid Panel - Comprehensive metabolic panel - Hemoglobin A1c  - follow up in 1 year   Clarene Reamer, FNP-BC  [facility]  06/21/2019 10:35 AM

## 2019-06-21 NOTE — Patient Instructions (Signed)
Good to meet you today  Return in 1 year for your complete physical  Consider getting HPV vaccination- you can just call office and schedule a nurse visit. It is a 3 shot series to protect against the HPV virus.   Continue to decrease soda and sweet tea, fast foods. Try to eat more vegetables, lean proteins, healthy starches- brown rice, sweet potatoes, beans.   I will notify you of lab results next week

## 2019-06-28 ENCOUNTER — Encounter (HOSPITAL_BASED_OUTPATIENT_CLINIC_OR_DEPARTMENT_OTHER): Payer: Self-pay | Admitting: *Deleted

## 2019-06-28 ENCOUNTER — Emergency Department (HOSPITAL_BASED_OUTPATIENT_CLINIC_OR_DEPARTMENT_OTHER)
Admission: EM | Admit: 2019-06-28 | Discharge: 2019-06-28 | Disposition: A | Discharge status: Home / Self Care | Payer: BC Managed Care – PPO | Attending: Emergency Medicine | Admitting: Emergency Medicine

## 2019-06-28 ENCOUNTER — Emergency Department (HOSPITAL_BASED_OUTPATIENT_CLINIC_OR_DEPARTMENT_OTHER): Payer: BC Managed Care – PPO

## 2019-06-28 ENCOUNTER — Other Ambulatory Visit: Payer: Self-pay

## 2019-06-28 DIAGNOSIS — N433 Hydrocele, unspecified: Secondary | ICD-10-CM | POA: Diagnosis not present

## 2019-06-28 DIAGNOSIS — N50812 Left testicular pain: Secondary | ICD-10-CM | POA: Diagnosis not present

## 2019-06-28 DIAGNOSIS — F17290 Nicotine dependence, other tobacco product, uncomplicated: Secondary | ICD-10-CM | POA: Diagnosis not present

## 2019-06-28 LAB — URINALYSIS, ROUTINE W REFLEX MICROSCOPIC
Bilirubin Urine: NEGATIVE
Glucose, UA: NEGATIVE mg/dL
Hgb urine dipstick: NEGATIVE
Ketones, ur: NEGATIVE mg/dL
Leukocytes,Ua: NEGATIVE
Nitrite: NEGATIVE
Protein, ur: NEGATIVE mg/dL
Specific Gravity, Urine: 1.025 (ref 1.005–1.030)
pH: 6 (ref 5.0–8.0)

## 2019-06-28 NOTE — ED Triage Notes (Signed)
Left Testicle pain that started on Wednesday.  Pt. Reports it would only hurt in the evenings and today it started hurting earlier.  Pt. Reports no swelling.  No pain with urination or with pooping.  Pt. Having normal BMs.

## 2019-06-28 NOTE — ED Provider Notes (Signed)
[facility] Provider Note   CSN: [id number] Arrival date & time: 06/28/19  1701     History   Chief Complaint Chief Complaint  Patient presents with  . Testicle Pain    HPI SOO RALL is a 18 y.o. male.     Patient with a 3-day history of some discomfort in the left testicle.  No swelling no urination of any blood no mass.  No discharge.  No known history of injury.  Tends to bother him more in the evenings.  But now starting to happen more often.  No fevers.  No upper respiratory symptoms.     Past Medical History:  Diagnosis Date  . Humerus fracture   . Obese     Patient Active Problem List   Diagnosis Date Noted  . Chest pain 04/18/2018  . Bradycardia 04/18/2018  . Cigarette smoker 04/18/2018  . 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 Closed supracondylar fracture of humerus 07/04/2012  . 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 Low back pain 02/29/2012  . Right leg pain 02/29/2012  . 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 Fracture of humerus 07/22/2011  . Attention deficit hyperactivity disorder (ADHD) 06/09/2009  . Encopresis 06/09/2009  . Overweight 07/22/2008    Past Surgical History:  Procedure Laterality Date  . HEMILAMINOTOMY LUMBAR SPINE    . LUMBAR FUSION    . LUMBAR LAMINECTOMY    . TYMPANOSTOMY TUBE PLACEMENT          Home Medications    Prior to Admission medications   Not on File    Family History Family History  Problem Relation Age of Onset  . Heart attack Maternal Uncle   . Breast cancer Maternal Grandmother     Social History Social History   Tobacco Use  . Smoking status: Never Smoker  . Smokeless tobacco: Current User  Substance Use Topics  . Alcohol use: No  . Drug use: Never     Allergies   Patient has no known allergies.   Review of Systems Review of Systems  Constitutional: Negative for  chills and fever.  HENT: Negative for congestion, rhinorrhea and sore throat.   Eyes: Negative for visual disturbance.  Respiratory: Negative for cough and shortness of breath.   Cardiovascular: Negative for chest pain and leg swelling.  Gastrointestinal: Negative for abdominal pain, diarrhea, nausea and vomiting.  Genitourinary: Positive for testicular pain. Negative for difficulty urinating, discharge, dysuria, flank pain, frequency, genital sores, hematuria and scrotal swelling.  Musculoskeletal: Negative for back pain and neck pain.  Skin: Negative for rash.  Neurological: Negative for dizziness, light-headedness and headaches.  Hematological: Does not bruise/bleed easily.  Psychiatric/Behavioral: Negative for confusion.     Physical Exam Updated Vital Signs BP 119/76 (BP Location: Right Arm)   Pulse 64   Temp 98.5 F (36.9 C) (Oral)   Resp 20   Ht 1.778 m (5\' 10" )   Wt 119.7 kg   SpO2 96%   BMI 37.88 kg/m   Physical Exam Vitals signs and nursing note reviewed.  Constitutional:      General: He is not in acute distress.    Appearance: Normal appearance. He is well-developed.  HENT:     Head: Normocephalic and atraumatic.  Eyes:     Conjunctiva/sclera: Conjunctivae normal.     Pupils: Pupils are equal, round, and reactive to light.  Neck:  Musculoskeletal: Neck supple.  Cardiovascular:     Rate and Rhythm: Normal rate and regular rhythm.     Heart sounds: No murmur.  Pulmonary:     Effort: Pulmonary effort is normal. No respiratory distress.     Breath sounds: Normal breath sounds.  Abdominal:     Palpations: Abdomen is soft.     Tenderness: There is no abdominal tenderness.  Genitourinary:    Penis: Normal.      Comments: Circumcised.  No discharge.  No evidence of any groin hernia.  No significant tenderness to palpation to the left testicle area.  No mass.  No swelling no erythema.  No tenderness to the right testicle area. Skin:    General: Skin is warm  and dry.  Neurological:     General: No focal deficit present.     Mental Status: He is alert and oriented to person, place, and time.      ED Treatments / Results  Labs (all labs ordered are listed, but only abnormal results are displayed) Labs Reviewed  URINALYSIS, ROUTINE W REFLEX MICROSCOPIC    EKG None  Radiology US Scrotum W/doppler  Result Date: 06/28/2019 CLINICAL DATA:  Left testicular pain EXAM: SCROTAL ULTRASOUND DOPPLER ULTRASOUND OF THE TESTICLES TECHNIQUE: Complete ultrasound examination of the testicles, epididymis, and other scrotal structures was performed. Color and spectral Doppler ultrasound were also utilized to evaluate blood flow to the testicles. COMPARISON:  None. FINDINGS: Right testicle Measurements: 4.9 x 2.7 x 3.2 cm. No mass or microlithiasis visualized. Left testicle Measurements: 4.7 x 2.7 x 3.2 cm. No mass or microlithiasis visualized. Right epididymis:  Normal in size and appearance. Left epididymis:  Normal in size and appearance. Hydrocele:  Small bilateral hydroceles. Varicocele:  None visualized. Pulsed Doppler interrogation of both testes demonstrates normal low resistance arterial and venous waveforms bilaterally. IMPRESSION: No testicular abnormality or evidence of torsion. Small bilateral hydroceles. Electronically Signed   By: Rolm Baptise M.D.   On: 06/28/2019 19:22    Procedures Procedures (including critical care time)  Medications Ordered in ED Medications - No data to display   Initial Impression / Assessment and Plan / ED Course  I have reviewed the triage vital signs and the nursing notes.  Pertinent labs & imaging results that were available during my care of the patient were reviewed by me and considered in my medical decision making (see chart for details).        Ultrasound Doppler negative no evidence of any torsion no evidence of any testicular mass.  No evidence of any significant inflammation.  Urinalysis negative.   Will treat with Motrin follow-up with urology if symptoms do not improve.  Final Clinical Impressions(s) / ED Diagnoses   Final diagnoses:  Testicular pain, left    ED Discharge Orders    None       Fredia Sorrow, MD 06/28/19 2119

## 2019-06-28 NOTE — Discharge Instructions (Addendum)
Ultrasound without evidence of any significant abnormality with the left testicle no evidence of any torsion no evidence of any inflammation no evidence of any tumor.  Would recommend taking Motrin for about a week.  If symptoms not improving referral information to follow-up with urology provided.  Give them a call.

## 2019-06-28 NOTE — ED Notes (Signed)
Pt. Reports discomfort in the L testicle since Wednesday with no swelling and no injury to the L leg or L scrotal area.

## 2019-06-28 NOTE — ED Notes (Signed)
Attempted to obtain urine and pt was unable to provide any at this time.

## 2019-07-03 ENCOUNTER — Ambulatory Visit: Payer: BC Managed Care – PPO | Admitting: Family Medicine

## 2019-07-22 DIAGNOSIS — D235 Other benign neoplasm of skin of trunk: Secondary | ICD-10-CM | POA: Diagnosis not present

## 2019-08-01 DIAGNOSIS — D235 Other benign neoplasm of skin of trunk: Secondary | ICD-10-CM | POA: Diagnosis not present

## 2019-08-06 ENCOUNTER — Ambulatory Visit: Payer: BC Managed Care – PPO | Admitting: Cardiology

## 2019-10-21 ENCOUNTER — Ambulatory Visit: Payer: BC Managed Care – PPO | Admitting: Family Medicine

## 2019-10-21 ENCOUNTER — Telehealth: Payer: Self-pay

## 2019-10-21 ENCOUNTER — Other Ambulatory Visit: Payer: Self-pay

## 2019-10-21 NOTE — Telephone Encounter (Signed)
[facility] - Client TELEPHONE ADVICE RECORD [facility] Patient Name: Jesse Knox Gender: Male DOB: 05-31-2001 Age: 19 Y 10 M 8 D Return Phone Number: [phone] (Primary) Address: City/State/Zip: [place] [address] Client [facility] - Client Client Site [place] - Day Physician Tor Netters- NP Contact Type Call Who Is Calling Patient / Member / Family / Caregiver Call Type Triage / Clinical Relationship To Patient Self Return Phone Number [phone] (Primary) Chief Complaint CHEST PAIN (>=21 years) - pain, pressure, heaviness or tightness Reason for Call Symptomatic / Request for [place] has chest discomfort and chest congestion. Transferred to [place] by staff at [facility] for triage. Translation No Nurse Assessment Nurse: Zenia Resides, RN, Diane Date/Time Eilene Ghazi Time): 10/21/2019 11:10:51 AM Confirm and document reason for call. If symptomatic, describe symptoms. ---Caller states he has chest discomfort that comes and goes. He also has congestion in his throat/top of his chest. No fever. No shortness of breath. 128/76 68 99% O2. Symptoms since last night. No other symptoms. Has the patient had close contact with a person known or suspected to have the novel coronavirus illness OR traveled / lives in area with major community spread (including international travel) in the last 14 days from the onset of symptoms? * If Asymptomatic, screen for exposure and travel within the last 14 days. ---No Does the patient have any new or worsening symptoms? ---Yes Will a triage be completed? ---Yes Related visit to physician within the last 2 weeks? ---No Does the PT have any chronic conditions? (i.e. diabetes, asthma, this includes High risk factors for pregnancy, etc.) ---No Is this a behavioral health or substance abuse call? ---No Guidelines Guideline Title  Affirmed Question Affirmed Notes Nurse Date/Time Eilene Ghazi Time) Chest Pain Chest pain(s) lasting a few seconds Zenia Resides, RN, Diane 10/21/2019 11:14:22 AM Disp. Time Eilene Ghazi Time) Disposition Final User 10/21/2019 11:09:09 AM Send to Urgent Queue [place], Erin PLEASE NOTE: All timestamps contained within this report are represented as [place] Standard Time. CONFIDENTIALTY NOTICE: This fax transmission is intended only for the addressee. It contains information that is legally privileged, confidential or otherwise protected from use or disclosure. If you are not the intended recipient, you are strictly prohibited from reviewing, disclosing, copying using or disseminating any of this information or taking any action in reliance on or regarding this information. If you have received this fax in error, please notify us immediately by telephone so that we can arrange for its return to [place]. Phone: [phone], Toll-Free: [phone], Fax: [phone] Page: 2 of 2 Call Id: [id number] 10/21/2019 11:16:26 AM SEE PCP WITHIN 3 DAYS Yes Zenia Resides, RN, Diane Disposition Overriden: Home Care Override Reason: Patient's symptoms need a higher level of care Caller Disagree/Comply Comply Caller Understands Yes PreDisposition Call a family member Care Advice Given Per Guideline SEE PCP WITHIN 3 DAYS: * You need to be seen within 2 or 3 days. Call your doctor (or NP/PA) during regular office hours and make an appointment. A clinic or urgent care center are good places to go for care if your doctor's office is closed or you can't get an appointment. NOTE: If office will be open tomorrow, tell caller to call then, not in 3 days. CALL BACK IF: * Difficulty breathing or unusual sweating occurs * Chest pain increases in frequency, duration or severity CARE ADVICE given per Chest Pain (Adult) guideline. Referrals REFERRED TO PCP OFFICE

## 2019-10-21 NOTE — Telephone Encounter (Addendum)
Pt was scheduled for virtual appt earlier today but pt called back to cancel appt and Loma Sousa said pt no longer needed the appt. FYI to Glenda Chroman FNP. I spoke with pts mom (on DPR ) and she said pt had cancelled because he was feeling better. Advised for pt to cb if decides needs to be seen or needs virtual appt. pts mom voiced understanding.

## 2019-10-21 NOTE — Telephone Encounter (Signed)
Noted  

## 2019-10-29 DIAGNOSIS — Z1152 Encounter for screening for COVID-19: Secondary | ICD-10-CM | POA: Diagnosis not present

## 2020-01-01 ENCOUNTER — Telehealth: Payer: Self-pay | Admitting: Family Medicine

## 2020-01-01 NOTE — Telephone Encounter (Signed)
FYI   Patient's mother called today  She stated that the patient was at work today and hit his head on a fork lift She stated that it is bleeding and they are concerned on what they should do. Mom thought he may need stitches or have it glued   Spoke with Triage and they advised that patient needed to go now to either the ED or UC to be evaluated  .   Mom voiced understanding and said they will go there now

## 2020-01-01 NOTE — Telephone Encounter (Signed)
Noted  

## 2020-03-10 ENCOUNTER — Other Ambulatory Visit: Payer: Self-pay

## 2020-03-10 ENCOUNTER — Ambulatory Visit: Payer: BC Managed Care – PPO | Admitting: Family Medicine

## 2020-03-10 ENCOUNTER — Encounter: Payer: Self-pay | Admitting: Family Medicine

## 2020-03-10 DIAGNOSIS — L729 Follicular cyst of the skin and subcutaneous tissue, unspecified: Secondary | ICD-10-CM | POA: Insufficient documentation

## 2020-03-10 MED ORDER — CEPHALEXIN 500 MG PO CAPS: 500.0000 mg | ORAL_CAPSULE | Freq: Three times a day (TID) | ORAL | 0 refills | Status: DC

## 2020-03-10 NOTE — Assessment & Plan Note (Signed)
Dime sized erythematous area on lower L buttock resembles ingrown hair with early infection  Not fluctuant  tx with keflex inst cleanse with soap and water regularly  Warm compresses/encourage drainage if it develops Update if not starting to improve in a week or if worsening  (watch for inc redness/swelling/pain)

## 2020-03-10 NOTE — Progress Notes (Signed)
Subjective:    Patient ID: Jesse Knox, male    DOB: 28-Apr-2001, 19 y.o.   MRN: [id number]  This visit occurred during the SARS-CoV-2 public health emergency.  Safety protocols were in place, including screening questions prior to the visit, additional usage of staff PPE, and extensive cleaning of exam room while observing appropriate contact time as indicated for disinfecting solutions.    HPI 19 yo pt of NP Carlean Purl presents with ? Abscess on buttocks   Wt Readings from Last 3 Encounters:  03/10/20 (!) 280 lb 9 oz (127.3 kg)  06/28/19 264 lb (119.7 kg)  06/21/19 265 lb (120.2 kg)   40.26 kg/m   Noticed a knot -lower left cheek  Noticed about 2 d ago  A little painful  No drainage  Little bit red   Not a common occurrence   No pain with bm   No recent exp to infection  Has not been in a medical facility    Patient Active Problem List   Diagnosis Date Noted  . Cyst of skin 03/10/2020  . Chest pain 04/18/2018  . Bradycardia 04/18/2018  . Cigarette smoker 04/18/2018  . 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 Closed supracondylar fracture of humerus 07/04/2012  . 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 Low back pain 02/29/2012  . Right leg pain 02/29/2012  . 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 Fracture of humerus 07/22/2011  . Attention deficit hyperactivity disorder (ADHD) 06/09/2009  . Encopresis 06/09/2009  . Overweight 07/22/2008   Past Medical History:  Diagnosis Date  . Humerus fracture   . Obese    Past Surgical History:  Procedure Laterality Date  . HEMILAMINOTOMY LUMBAR SPINE    . LUMBAR FUSION    . LUMBAR LAMINECTOMY    . TYMPANOSTOMY TUBE PLACEMENT     Social History   Tobacco Use  . Smoking status: Never Smoker  . Smokeless tobacco: Current User  Substance Use Topics  . Alcohol use: No  . Drug use: Never   Family History  Problem Relation Age of Onset    . Heart attack Maternal Uncle   . Breast cancer Maternal Grandmother    No Known Allergies No current outpatient medications on file prior to visit.   No current facility-administered medications on file prior to visit.      Review of Systems  Constitutional: Negative for activity change, appetite change, fatigue, fever and unexpected weight change.  HENT: Negative for congestion, rhinorrhea, sore throat and trouble swallowing.   Eyes: Negative for pain, redness, itching and visual disturbance.  Respiratory: Negative for cough, chest tightness, shortness of breath and wheezing.   Cardiovascular: Negative for chest pain and palpitations.  Gastrointestinal: Negative for abdominal pain, blood in stool, constipation, diarrhea and nausea.  Endocrine: Negative for cold intolerance, heat intolerance, polydipsia and polyuria.  Genitourinary: Negative for difficulty urinating, dysuria, frequency and urgency.  Musculoskeletal: Negative for arthralgias, joint swelling and myalgias.  Skin: Positive for wound. Negative for pallor and rash.  Neurological: Negative for dizziness, tremors, weakness, numbness and headaches.  Hematological: Negative for adenopathy. Does not bruise/bleed easily.  Psychiatric/Behavioral: Negative for decreased concentration and dysphoric mood. The patient is not nervous/anxious.        Objective:   Physical Exam Constitutional:      General: He is not in acute distress.    Appearance: Normal appearance. He is obese. He is not ill-appearing.  HENT:     Head: Normocephalic and atraumatic.  Eyes:     General: No scleral icterus.    Conjunctiva/sclera: Conjunctivae normal.     Pupils: Pupils are equal, round, and reactive to light.  Cardiovascular:     Rate and Rhythm: Regular rhythm. Tachycardia present.     Pulses: Normal pulses.  Pulmonary:     Effort: Pulmonary effort is normal. No respiratory distress.  Musculoskeletal:     Cervical back: Neck supple.   Skin:    General: Skin is warm and dry.     Comments: 1-2 cm round area of erythema and induration on lower L buttock consistent with ingrown hair/early infection  Not fluctuant  No drainage Minimally tender No streaking   Noted other areas of folliculitis on buttocks bilat  Neurological:     Mental Status: He is alert.     Sensory: No sensory deficit.  Psychiatric:        Mood and Affect: Mood normal.           Assessment & Plan:   Problem List Items Addressed This Visit      Musculoskeletal and Integument   Cyst of skin    Dime sized erythematous area on lower L buttock resembles ingrown hair with early infection  Not fluctuant  tx with keflex inst cleanse with soap and water regularly  Warm compresses/encourage drainage if it develops Update if not starting to improve in a week or if worsening  (watch for inc redness/swelling/pain)

## 2020-03-10 NOTE — Patient Instructions (Signed)
Keep affected area clean with soap and water (antibacterial if possible)  Warm compress or warm bath may help also  This may rupture and drain - you can cover loosely with gauze and keep it clean   Take keflex as directed for skin infection  If any problems let us know   Update if not starting to improve in a week or if worsening

## 2020-03-24 DIAGNOSIS — Z1152 Encounter for screening for COVID-19: Secondary | ICD-10-CM | POA: Diagnosis not present

## 2020-03-24 DIAGNOSIS — J029 Acute pharyngitis, unspecified: Secondary | ICD-10-CM | POA: Diagnosis not present

## 2021-03-11 ENCOUNTER — Encounter (HOSPITAL_BASED_OUTPATIENT_CLINIC_OR_DEPARTMENT_OTHER): Payer: Self-pay | Admitting: Emergency Medicine

## 2021-03-11 ENCOUNTER — Emergency Department (HOSPITAL_BASED_OUTPATIENT_CLINIC_OR_DEPARTMENT_OTHER)
Admission: EM | Admit: 2021-03-11 | Discharge: 2021-03-11 | Disposition: A | Discharge status: Home / Self Care | Payer: Self-pay | Attending: Emergency Medicine | Admitting: Emergency Medicine

## 2021-03-11 ENCOUNTER — Emergency Department (HOSPITAL_BASED_OUTPATIENT_CLINIC_OR_DEPARTMENT_OTHER): Payer: Self-pay

## 2021-03-11 ENCOUNTER — Other Ambulatory Visit: Payer: Self-pay

## 2021-03-11 ENCOUNTER — Other Ambulatory Visit (HOSPITAL_BASED_OUTPATIENT_CLINIC_OR_DEPARTMENT_OTHER): Payer: Self-pay

## 2021-03-11 DIAGNOSIS — R000 Tachycardia, unspecified: Secondary | ICD-10-CM | POA: Insufficient documentation

## 2021-03-11 DIAGNOSIS — F17290 Nicotine dependence, other tobacco product, uncomplicated: Secondary | ICD-10-CM | POA: Insufficient documentation

## 2021-03-11 DIAGNOSIS — R599 Enlarged lymph nodes, unspecified: Secondary | ICD-10-CM | POA: Insufficient documentation

## 2021-03-11 DIAGNOSIS — U071 COVID-19: Secondary | ICD-10-CM | POA: Insufficient documentation

## 2021-03-11 DIAGNOSIS — Z28311 Partially vaccinated for covid-19: Secondary | ICD-10-CM | POA: Insufficient documentation

## 2021-03-11 LAB — BASIC METABOLIC PANEL
Anion gap: 11 (ref 5–15)
BUN: 14 mg/dL (ref 6–20)
CO2: 23 mmol/L (ref 22–32)
Calcium: 9.3 mg/dL (ref 8.9–10.3)
Chloride: 102 mmol/L (ref 98–111)
Creatinine, Ser: 1.18 mg/dL (ref 0.61–1.24)
GFR, Estimated: >60 mL/min (ref >60)
Glucose, Bld: 97 mg/dL (ref 70–99)
Potassium: 3.8 mmol/L (ref 3.5–5.1)
Sodium: 136 mmol/L (ref 135–145)

## 2021-03-11 LAB — RESP PANEL BY RT-PCR (FLU A&B, COVID) ARPGX2
Influenza A by PCR: NEGATIVE
Influenza B by PCR: NEGATIVE
SARS Coronavirus 2 by RT PCR: POSITIVE — AB

## 2021-03-11 LAB — CBC WITH DIFFERENTIAL/PLATELET
Abs Immature Granulocytes: 0.04 10*3/uL (ref 0.00–0.07)
Basophils Absolute: 0 10*3/uL (ref 0.0–0.1)
Basophils Relative: 1 %
Eosinophils Absolute: 0 10*3/uL (ref 0.0–0.5)
Eosinophils Relative: 1 %
HCT: 43 % (ref 39.0–52.0)
Hemoglobin: 14.7 g/dL (ref 13.0–17.0)
Immature Granulocytes: 1 %
Lymphocytes Relative: 21 %
Lymphs Abs: 1.2 10*3/uL (ref 0.7–4.0)
MCH: 27.7 pg (ref 26.0–34.0)
MCHC: 34.2 g/dL (ref 30.0–36.0)
MCV: 81.1 fL (ref 80.0–100.0)
Monocytes Absolute: 0.7 10*3/uL (ref 0.1–1.0)
Monocytes Relative: 12 %
Neutro Abs: 3.9 10*3/uL (ref 1.7–7.7)
Neutrophils Relative %: 64 %
Platelets: 214 10*3/uL (ref 150–400)
RBC: 5.3 MIL/uL (ref 4.22–5.81)
RDW: 13 % (ref 11.5–15.5)
WBC: 5.9 10*3/uL (ref 4.0–10.5)
nRBC: 0 % (ref 0.0–0.2)

## 2021-03-11 LAB — TROPONIN I (HIGH SENSITIVITY): Troponin I (High Sensitivity): <2 ng/L (ref <18)

## 2021-03-11 LAB — GROUP A STREP BY PCR: Group A Strep by PCR: NOT DETECTED

## 2021-03-11 MED ORDER — LACTATED RINGERS IV BOLUS
1000.0000 mL | Freq: Once | INTRAVENOUS | Status: AC
  Administered 2021-03-11: 1000 mL via INTRAVENOUS

## 2021-03-11 MED ORDER — NIRMATRELVIR/RITONAVIR (PAXLOVID)TABLET
3.0000 | ORAL_TABLET | Freq: Two times a day (BID) | ORAL | 0 refills | Status: AC
  Filled 2021-03-11: qty 30, 5d supply, fill #0

## 2021-03-11 MED ORDER — ACETAMINOPHEN 325 MG PO TABS
650.0000 mg | ORAL_TABLET | Freq: Once | ORAL | Status: AC
  Administered 2021-03-11: 650 mg via ORAL
  Filled 2021-03-11: qty 2

## 2021-03-11 NOTE — Discharge Instructions (Addendum)
You are seen in the ER today for your sore throat, cough, and general fatigue.  Your blood work, EKG, and chest x-ray were reassuring as was your physical exam, however you did test positive for COVID-19.  You were offered antiviral medication to take to treat your COVID-19 infection, which you chose to proceed with.  Please take this as prescribed.  Be aware that this will not relieve her symptoms, but may shorten the course of your illness and prevent you from developing complications of [id number] infection.  May use over-the-counter medications as needed for your symptoms and you should isolate yourself for 7 days following onset of your symptoms.  May return to work next Tuesday, 03/16/21 if you are fever free for x24 hours without Tylenol or ibuprofen.  Return to ER if develop any chest pain, shortness of breath, nausea vomiting that does not stop, or any other severe symptoms.

## 2021-03-11 NOTE — ED Provider Notes (Signed)
[facility] Provider Note   CSN: [id number] Arrival date & time: 03/11/21  0940     History Chief Complaint  Patient presents with   Cough    Jesse Knox is a 20 y.o. male who presents with concern for 3 days of progressive worsening sore throat, headache, and intermittent lightheadedness.  Tolerating p.o. poorly due to soreness of throat.  Denies any difficulty swallowing severe pain.  Additionally endorses productive cough with clear to green mucus.  He has had 1 dose for COVID-19 vaccination approximately 1 year ago.  Denies any nausea, vomiting, diarrhea, fevers, or chills.  I personally read this patient's medical records.  He has history of ADHD, lumbar myelopathy, benign neoplasm of the humerus, and bradycardia.  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   Fracture of humerus 07/22/2011   Attention deficit hyperactivity disorder (ADHD) 06/09/2009   Encopresis 06/09/2009   Overweight 07/22/2008    Past Surgical History:  Procedure Laterality Date   HEMILAMINOTOMY LUMBAR SPINE     LUMBAR FUSION     LUMBAR LAMINECTOMY     TYMPANOSTOMY TUBE PLACEMENT         Family History  Problem Relation Age of Onset   Heart attack Maternal Uncle    Breast cancer Maternal Grandmother     Social History   Tobacco Use   Smoking status: Never   Smokeless tobacco: Current  Substance Use Topics   Alcohol use: No   Drug use: Never    Home  Medications Prior to Admission medications   Medication Sig Start Date End Date Taking? Authorizing Provider  cephALEXin (KEFLEX) 500 MG capsule Take 1 capsule (500 mg total) by mouth 3 (three) times daily. 03/10/20   Tower, Wynelle Fanny, MD    Allergies    Patient has no known allergies.  Review of Systems   Review of Systems  Constitutional:  Positive for activity change, appetite change and fatigue. Negative for chills, diaphoresis and fever.  HENT:  Positive for sore throat. Negative for congestion, trouble swallowing and voice change.   Eyes: Negative.   Respiratory:  Positive for cough and chest tightness. Negative for shortness of breath.   Cardiovascular:  Positive for chest pain. Negative for palpitations and leg swelling.  Gastrointestinal: Negative.   Genitourinary: Negative.   Musculoskeletal: Negative.   Skin: Negative.   Neurological:  Positive for light-headedness and headaches. Negative for dizziness and weakness.   Physical Exam Updated Vital Signs BP 129/68   Pulse (!) 102   Temp 100 F (37.8 C) (Oral)   Resp (!) 24   Ht 6' (1.829 m)   Wt 127 kg   SpO2 97%   BMI 37.97 kg/m   Physical Exam Vitals and nursing note reviewed.  Constitutional:      Appearance: He is obese. He is not ill-appearing or toxic-appearing.  HENT:     Head: Normocephalic and atraumatic.  Nose: Nose normal.     Mouth/Throat:     Mouth: Mucous membranes are moist.     Pharynx: Uvula midline. Posterior oropharyngeal erythema present. No oropharyngeal exudate.     Tonsils: No tonsillar exudate. 2+ on the right. 2+ on the left.  Eyes:     General: Lids are normal. Vision grossly intact.        Right eye: No discharge.        Left eye: No discharge.     Extraocular Movements: Extraocular movements intact.     Conjunctiva/sclera: Conjunctivae normal.     Pupils: Pupils are equal, round, and reactive to light.  Neck:     Trachea: Trachea and phonation normal.  Cardiovascular:      Rate and Rhythm: Regular rhythm. Tachycardia present.     Pulses: Normal pulses.     Heart sounds: Normal heart sounds. No murmur heard. Pulmonary:     Effort: Pulmonary effort is normal. No tachypnea, bradypnea, accessory muscle usage, prolonged expiration or respiratory distress.     Breath sounds: Normal breath sounds. No wheezing or rales.  Chest:     Chest wall: No mass, lacerations, deformity, swelling, tenderness or crepitus.  Abdominal:     General: Bowel sounds are normal. There is no distension.     Palpations: Abdomen is soft.     Tenderness: There is no abdominal tenderness. There is no guarding.  Musculoskeletal:        General: No deformity.     Cervical back: Normal range of motion and neck supple. No edema, rigidity or crepitus. No pain with movement, spinous process tenderness or muscular tenderness.     Right lower leg: No edema.     Left lower leg: No edema.  Lymphadenopathy:     Cervical: Cervical adenopathy present.     Right cervical: Superficial cervical adenopathy present.     Left cervical: Superficial cervical adenopathy present.  Skin:    General: Skin is warm and dry.     Capillary Refill: Capillary refill takes less than 2 seconds.  Neurological:     General: No focal deficit present.     Mental Status: He is alert and oriented to person, place, and time. Mental status is at baseline.     GCS: GCS eye subscore is 4. GCS verbal subscore is 5. GCS motor subscore is 6.     Sensory: Sensation is intact.     Motor: Motor function is intact.     Gait: Gait is intact.  Psychiatric:        Mood and Affect: Mood normal.    ED Results / Procedures / Treatments   Labs (all labs ordered are listed, but only abnormal results are displayed) Labs Reviewed  RESP PANEL BY RT-PCR (FLU A&B, COVID) [id number] - Abnormal; Notable for the following components:      Result Value   SARS Coronavirus 2 by RT PCR POSITIVE (*)    All other components within normal limits   GROUP A STREP BY PCR  BASIC METABOLIC PANEL  CBC WITH DIFFERENTIAL/PLATELET  TROPONIN I (HIGH SENSITIVITY)  TROPONIN I (HIGH SENSITIVITY)    EKG EKG Interpretation  Date/Time:  Thursday March 11 2021 10:07:06 EDT Ventricular Rate:  113 PR Interval:  156 QRS Duration: 82 QT Interval:  289 QTC Calculation: 397 R Axis:   80 Text Interpretation: Sinus tachycardia Borderline T abnormalities, inferior leads Confirmed by Thamas Jaegers ([id number]) on 03/11/2021 10:46:38 AM  Radiology DG Chest Portable 1 View  Result Date: 03/11/2021 CLINICAL DATA:  Cough, chest pain EXAM: PORTABLE CHEST 1 VIEW COMPARISON:  04/13/2018 FINDINGS: Heart and mediastinal contours are within normal limits. No focal opacities or effusions. No acute bony abnormality. IMPRESSION: No active disease. Electronically Signed   By: Rolm Baptise M.D.   On: 03/11/2021 10:51    Procedures Procedures   Medications Ordered in ED Medications  lactated ringers bolus 1,000 mL (1,000 mLs Intravenous New Bag/Given 03/11/21 1038)  acetaminophen (TYLENOL) tablet 650 mg (650 mg Oral Given 03/11/21 1035)    ED Course  I have reviewed the triage vital signs and the nursing notes.  Pertinent labs & imaging results that were available during my care of the patient were reviewed by me and considered in my medical decision making (see chart for details).    MDM Rules/Calculators/A&P                         20 year old male who presents with concern for 3 days of sore throat, headache, and lightheadedness.  He is not completed vaccination for COVID-19.  Differential diagnosis for this patient symptoms includes not limited to streptococcal pharyngitis, gonococcal pharyngitis, viral pharyngitis secondary to COVID-19/influenza A/B/other acute viral URI.  Tachycardic and tachypneic on intake, borderline febrile with temperature of 100 F.  Cardiac exam did reveal persistent tachycardia with regular rhythm.  Pulmonary exam unremarkable.   Abdominal exam unremarkable.  HEENT exam did reveal edematous tonsils bilaterally with posterior pharyngeal erythema and tonsillar erythema without exudate.  Airways patent.  Shotty superficial anterior cervical lymphadenopathy, trachea normal, no anterior neck crepitus or tenderness palpation.  Basic labs obtained in triage.  CBC unremarkable, BMP unremarkable.  Troponin negative, less than 2.  EKG as above with sinus tachycardia, chest x-ray negative for acute cardiopulmonary disease.  Respiratory pathogen panel positive for COVID-19, and negative for influenza A/B. STREP negative.  Shared decision-making with patient regarding antiviral therapy with Paxlovid, as he is on day 3 of symptoms and is eligible without any medications or contraindications.  He voiced understanding of his treatment options, and expressed wishes to proceed with antivirals with Paxlovid at this time.   Heart rate has improved now very mildly tachycardic to 102 following fluid bolus.  No further comport in the [place] time given reassuring laboratory studies, chest x-ray, and EKG as well as physical exam.  Jesse voiced understanding with medical evaluation and treatment plan.  Each of his questions answered to his expressed inspection.  Return precautions given.  Patient is well-appearing, stable, and appropriate for discharge at this time.  This chart was dictated using voice recognition software, Dragon. Despite the best efforts of this provider to proofread and correct errors, errors may still occur which can change documentation meaning.  DHILLON Knox was evaluated in Emergency Department on 03/11/2021 for the symptoms described in the history of present illness. He was evaluated in the context of the global COVID-19 pandemic, which necessitated consideration that the patient might be at risk for infection with the SARS-CoV-2 virus that causes COVID-19. Institutional protocols and algorithms that pertain to the evaluation of  patients at risk for COVID-19 are in a state of rapid change based on information released by regulatory bodies including the CDC and federal and state organizations. These policies and algorithms were followed during the patient's care in the ED.  Final Clinical Impression(s) / ED Diagnoses Final diagnoses:  None    Rx / DC Orders ED Discharge Orders  None        Aura Dials 03/11/21 1539    Luna Fuse, MD 03/12/21 1056

## 2021-03-11 NOTE — ED Triage Notes (Signed)
Patient complaining of cough, chest discomfort, sore throat, weakness, dizziness, and mild disorientation x 2 days.

## 2021-05-07 IMAGING — US US SCROTUM W/ DOPPLER COMPLETE
1 series · 14 of 25 positions shown · non-contrast
Comparison: None.

CLINICAL DATA: Left testicular pain

EXAM:
SCROTAL ULTRASOUND
DOPPLER ULTRASOUND OF THE TESTICLES
TECHNIQUE: Complete ultrasound examination of the testicles, epididymis, and
other scrotal structures was performed. Color and spectral Doppler
ultrasound were also utilized to evaluate blood flow to the
testicles.

[Series 1: us scrotum w/ doppler complete · 14 of 46 slices shown]
[im 1/46]
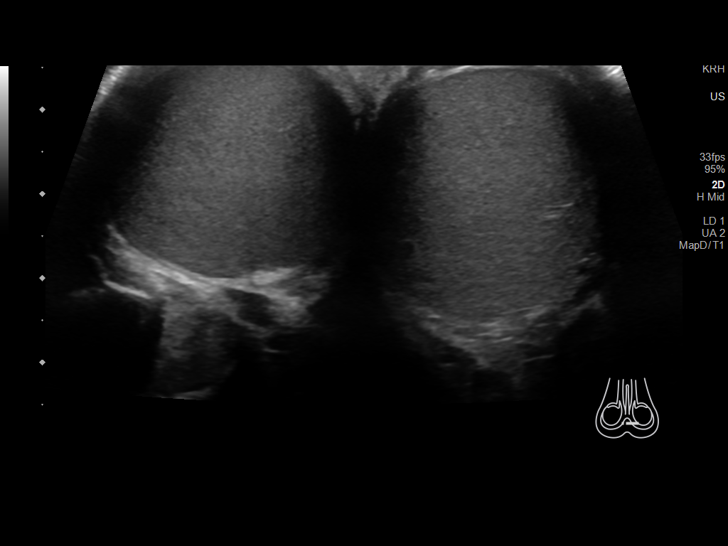
[im 4/46]
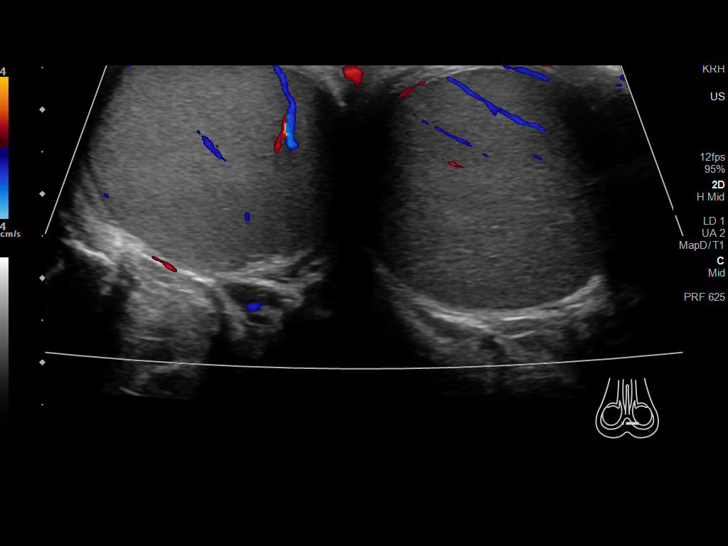
[im 8/46]
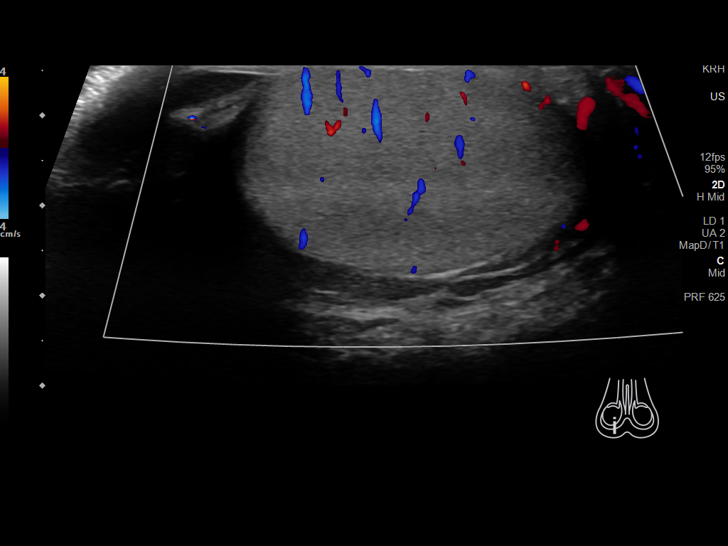
[im 12/46]
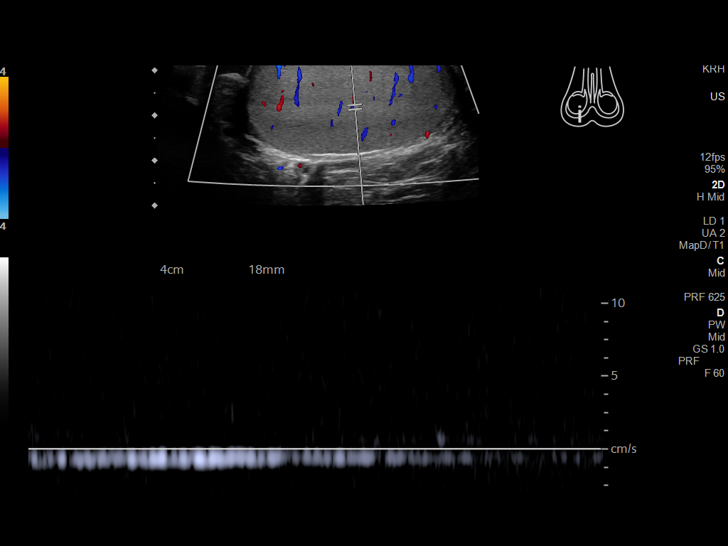
[im 16/46]
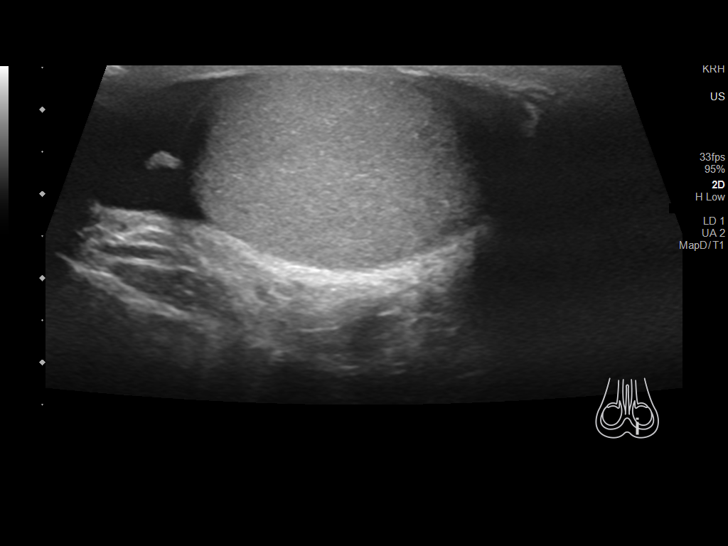
[im 17/46]
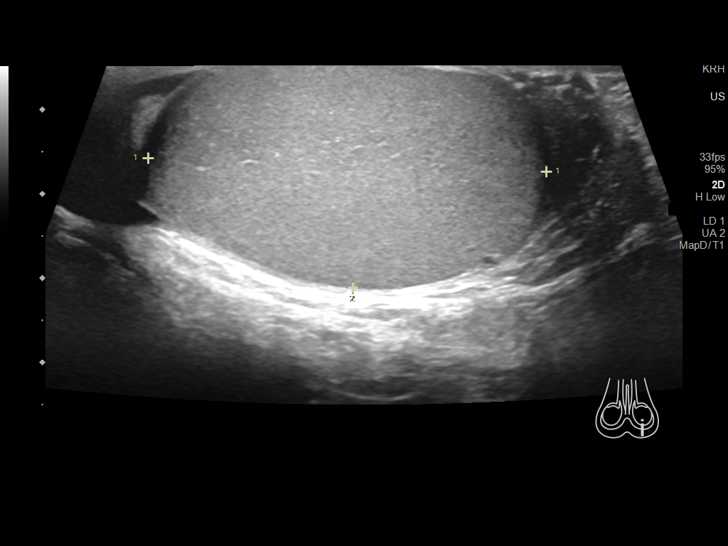
[im 21/46]
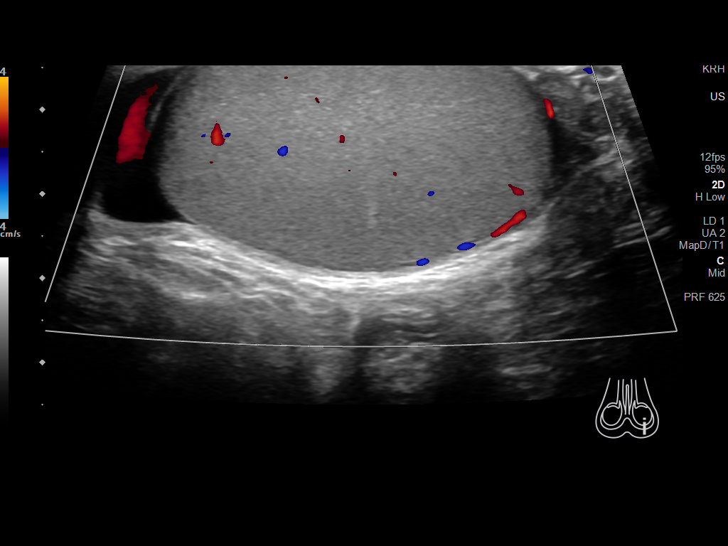
[im 25/46]
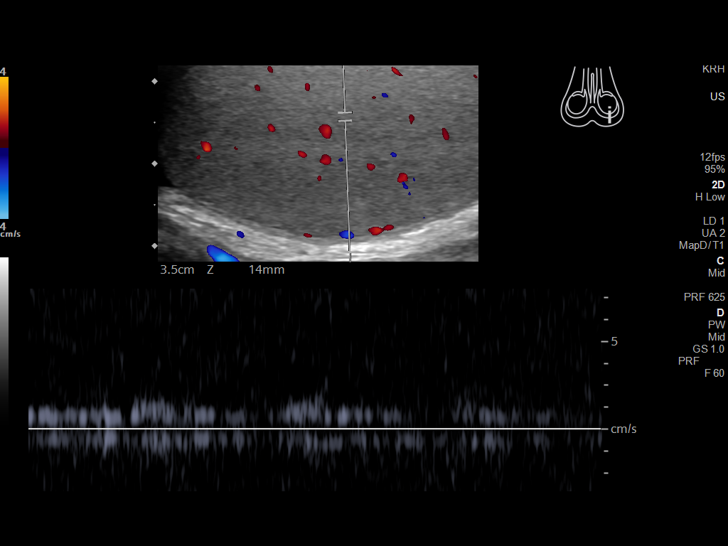
[im 29/46]
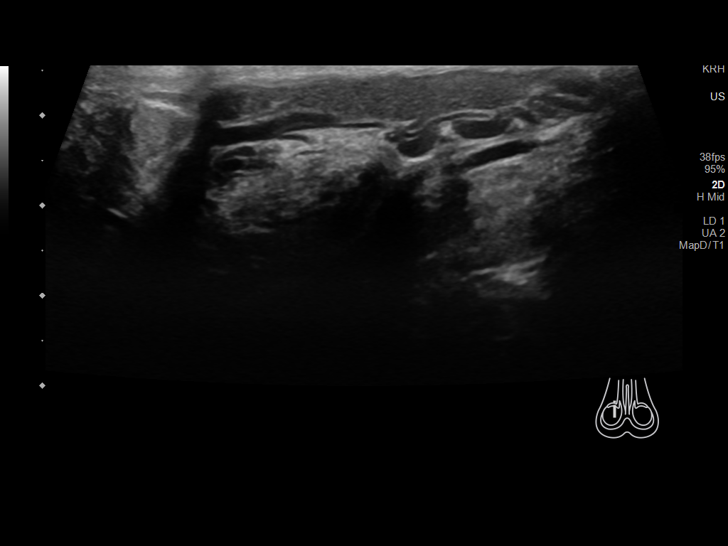
[im 31/46]
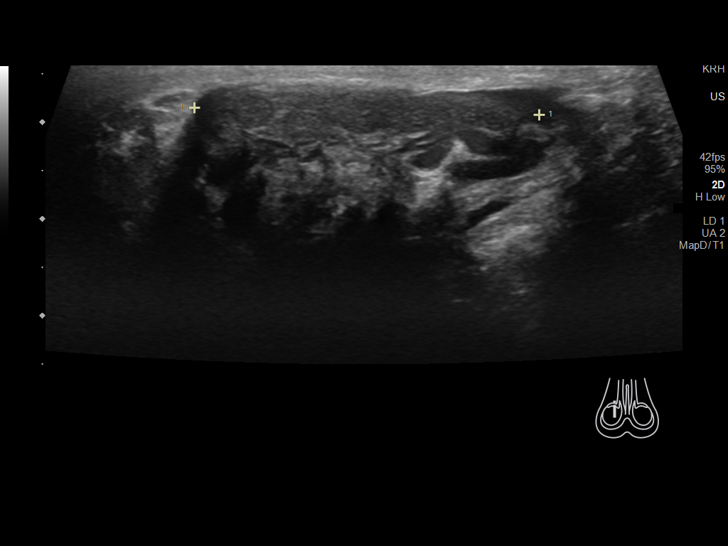
[im 34/46]
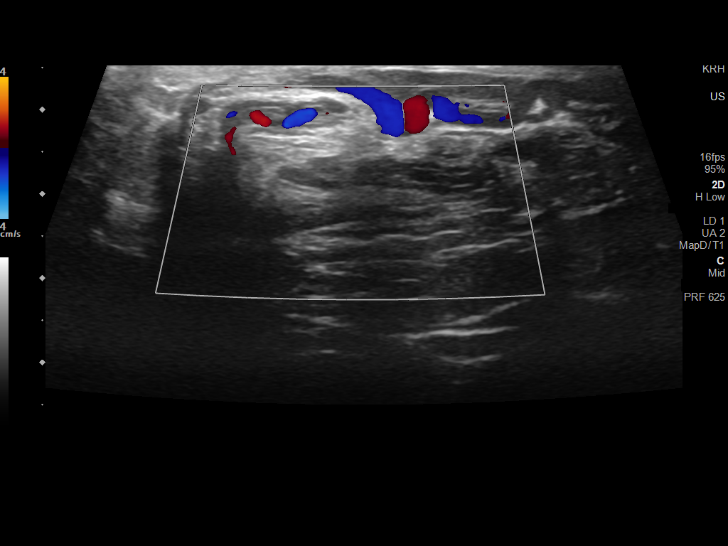
[im 38/46]
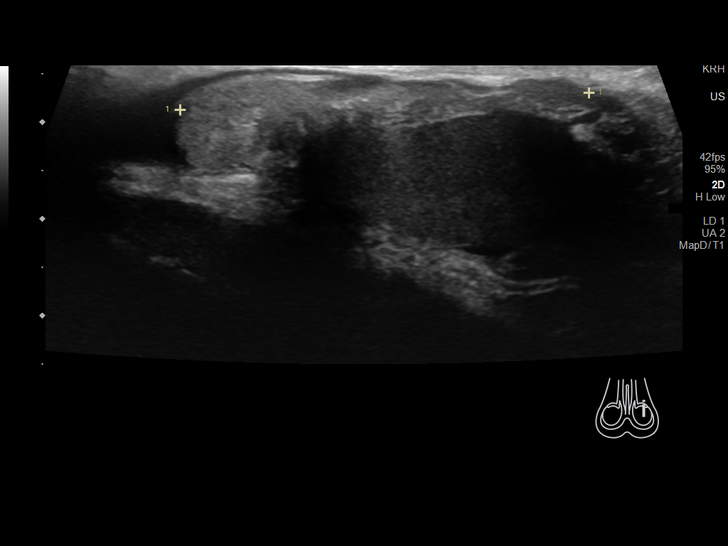
[im 42/46]
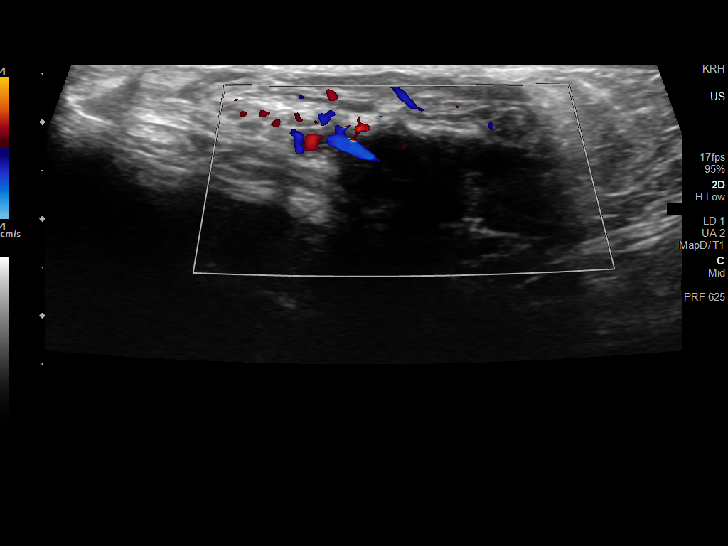
[im 46/46]
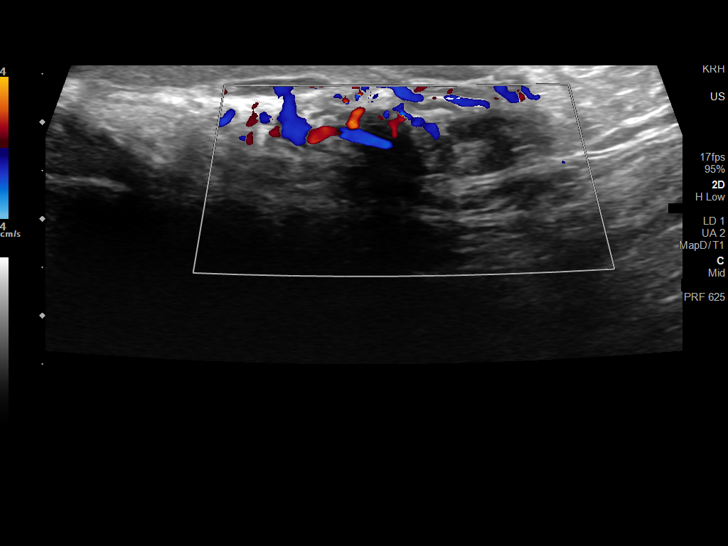

[14 of 25 positions shown; findings below may reference images not displayed]

FINDINGS: Right testicle

Measurements: 4.9 x 2.7 x 3.2 cm. No mass or microlithiasis
visualized.

Left testicle

Measurements: 4.7 x 2.7 x 3.2 cm. No mass or microlithiasis
visualized.

Right epididymis:  Normal in size and appearance.

Left epididymis:  Normal in size and appearance.

Hydrocele:  Small bilateral hydroceles.

Varicocele:  None visualized.

Pulsed Doppler interrogation of both testes demonstrates normal low
resistance arterial and venous waveforms bilaterally.
IMPRESSION: No testicular abnormality or evidence of torsion.

Small bilateral hydroceles.

## 2023-01-19 IMAGING — DX DG CHEST 1V PORT
1 series · 1 of 1 positions shown · non-contrast
Comparison: 04/13/2018

CLINICAL DATA: Cough, chest pain

EXAM:
PORTABLE CHEST 1 VIEW

[chest ap]
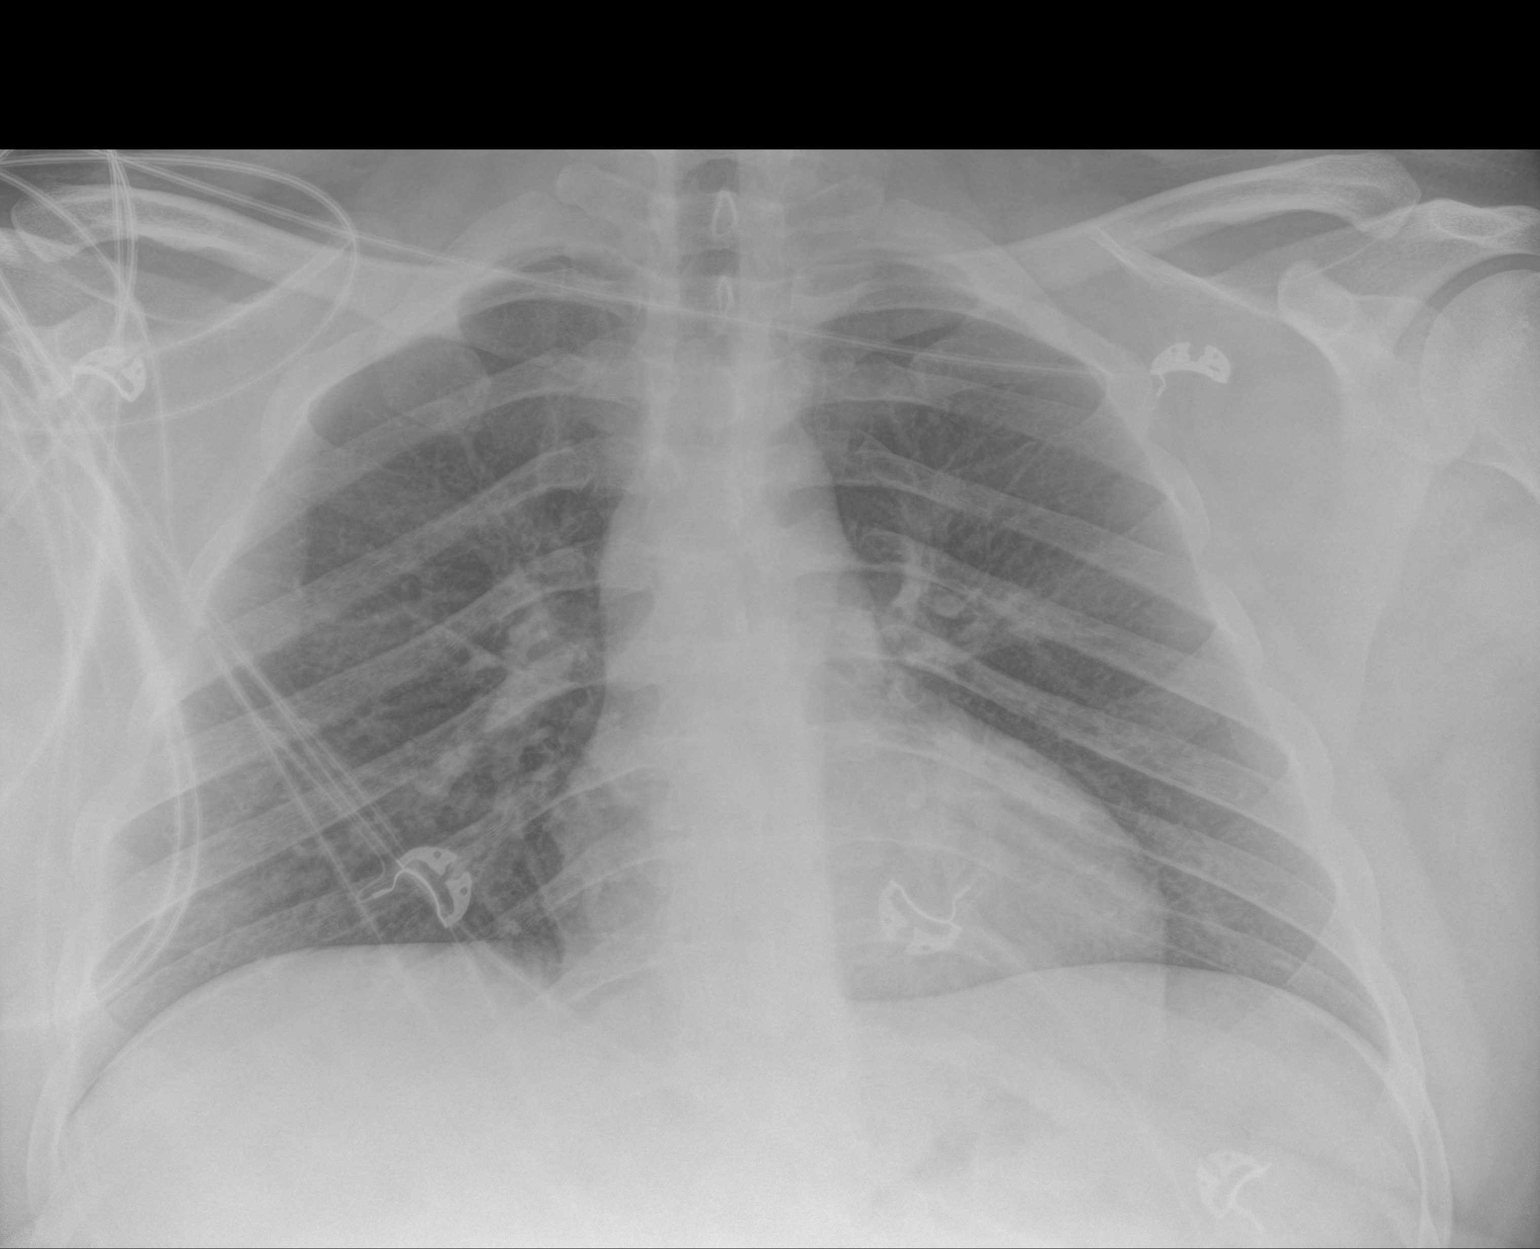

[1 of 1 positions shown; findings below may reference images not displayed]

FINDINGS: Heart and mediastinal contours are within normal limits. No focal
opacities or effusions. No acute bony abnormality.
IMPRESSION: No active disease.

## 2024-04-10 ENCOUNTER — Ambulatory Visit (INDEPENDENT_AMBULATORY_CARE_PROVIDER_SITE_OTHER): Payer: Self-pay

## 2024-04-10 VITALS — BP 119/77 | HR 71 | Temp 97.4°F | Ht 72.0 in | Wt 270.0 lb

## 2024-04-10 DIAGNOSIS — Z23 Encounter for immunization: Secondary | ICD-10-CM

## 2024-04-10 DIAGNOSIS — E66812 Obesity, class 2: Secondary | ICD-10-CM | POA: Insufficient documentation

## 2024-04-10 DIAGNOSIS — Z7689 Persons encountering health services in other specified circumstances: Secondary | ICD-10-CM | POA: Insufficient documentation

## 2024-04-10 DIAGNOSIS — Z6836 Body mass index (BMI) 36.0-36.9, adult: Secondary | ICD-10-CM

## 2024-04-10 NOTE — Assessment & Plan Note (Signed)
 Jesse Knox, 23, presented for routine health maintenance. No current issues with past herniated disc and lumbar fusion. Due for screenings and vaccinations. - Schedule fasting labs: kidney and liver function, cholesterol panel, A1c, vitamin D, thyroid  function. Will f/u with patient via MyChart with results. - Administer tetanus vaccine today. - Discussed and offered flu vaccine.  - Will hold off on HIV and Hep C screenings due to the patient being self pay. - Plan for next CPE in 1 year.

## 2024-04-10 NOTE — Patient Instructions (Addendum)
 VISIT SUMMARY: Jesse Knox, you came in today for an initial appointment to establish care. We reviewed your past medical history, including your lumbar herniated disc surgery and tumor removal, both of which have no ongoing issues. We also discussed your immunization status and the need for routine health maintenance.  YOUR PLAN: -ADULT WELLNESS VISIT: An adult wellness visit is a routine check-up to review your overall health and preventive care needs. We will schedule fasting labs to check your kidney and liver function, cholesterol levels, blood sugar (A1c), and thyroid  function. You are also due for a tetanus booster, which we will administer today. Additionally, we discussed and offered the flu vaccine.  INSTRUCTIONS: Please schedule your fasting labs as soon as possible. We will administer the tetanus vaccine today. If you decide to get the flu vaccine, please let us  know.  If you have any problems before your next visit feel free to message me via MyChart (minor issues or questions) or call the office, otherwise you may reach out to schedule an office visit.  Thank you! Saddie Sacks, PA-C

## 2024-04-10 NOTE — Progress Notes (Signed)
 New Patient Office Visit  Subjective    Patient ID: Jesse Knox, male    DOB: Oct 27, 2000  Age: 23 y.o. MRN: [id number]  CC:  Chief Complaint  Patient presents with   New Patient (Initial Visit)    Establish care    History of Present Illness   Jesse Knox is a 23 year old male who presents for an establishing care appointment. Last physical was with his pediatrician 7 years ago. He is not currently on any daily medications. 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 Does not have any additional issues to discuss today.   Musculoskeletal history - Lumbar herniated disc in 2013 requiring lumbar fusion and laminectomy - Severe back pain in 2013 resulting in inability to ambulate for one year - Frequent emergency department visits prior to diagnosis - MRI led to diagnosis and subsequent surgery by neurology - No ongoing musculoskeletal issues since surgery  Immunization status - Last tetanus vaccine in 2011 - Due for tetanus booster      Screenings:  Colon Cancer: N/A Lung Cancer: N/A Breast Cancer: N/A Diabetes: Checking A1c with labs HLD: Checking lipid panel with labs The ASCVD Risk score (Arnett DK, et al., 2019) failed to calculate for the following reasons:   The 2019 ASCVD risk score is only valid for ages 73 to 77   Acute Problems: None  Outpatient Encounter Medications as of 04/10/2024  Medication Sig   [DISCONTINUED] cephALEXin  (KEFLEX ) 500 MG capsule Take 1 capsule (500 mg total) by mouth 3 (three) times daily. (Patient not taking: Reported on 04/10/2024)   No facility-administered encounter medications on file as of 04/10/2024.    Past Medical History:  Diagnosis Date   Humerus fracture    Obese     Past Surgical History:  Procedure Laterality Date   HEMILAMINOTOMY LUMBAR SPINE     LUMBAR FUSION     LUMBAR LAMINECTOMY     TYMPANOSTOMY TUBE PLACEMENT      Family History  Problem Relation Age of Onset   Heart attack Maternal Uncle     Breast cancer Maternal Grandmother     Social History   Socioeconomic History   Marital status: Single    Spouse name: Not on file   Number of children: Not on file   Years of education: Not on file   Highest education level: High school graduate  Occupational History   Occupation: Truck driver/ [employer]  Tobacco Use   Smoking status: Former    Current packs/day: 0.00    Types: Cigarettes    Quit date: 2021    Years since quitting: 4.6   Smokeless tobacco: Current  Substance and Sexual Activity   Alcohol use: Yes   Drug use: Never   Sexual activity: Yes  Other Topics Concern   Not on file  Social History Narrative   Caffeine Intake: Coffee/tea/soda: 1 cup per day   Weight: Are you satisfied with your weight? Yes   Diet: How do you rate your diet? Good   Do you eat or drink 4 servings of dairy or soy daily or take calcium supplements? No   Exercise: Do you exercise regularly? Yes   What type of exercise? Strength/ Cardio   How long(minutes) 60   How often? 5x/ week   Social Drivers of Corporate investment banker Strain: Not on file  Food Insecurity: No Food Insecurity (04/10/2024)   Hunger Vital Sign    Worried About Running Out of  Food in the Last Year: Never true    Ran Out of Food in the Last Year: Never true  Transportation Needs: No Transportation Needs (04/10/2024)   PRAPARE - Administrator, Civil Service (Medical): No    Lack of Transportation (Non-Medical): No  Physical Activity: Not on file  Stress: Not on file  Social Connections: Not on file  Intimate Partner Violence: Not At Risk (04/10/2024)   Humiliation, Afraid, Rape, and Kick questionnaire    Fear of Current or Ex-Partner: No    Emotionally Abused: No    Physically Abused: No    Sexually Abused: No    ROS  Per HPI      Objective    BP 119/77   Pulse 71   Temp (!) 97.4 F (36.3 C) (Oral)   Ht 6' (1.829 m) Comment: Patient weighed in with steel-toe boots on.  Wt  270 lb (122.5 kg)   SpO2 98%   BMI 36.62 kg/m   Physical Exam Constitutional:      General: He is not in acute distress.    Appearance: Normal appearance.  HENT:     Right Ear: Tympanic membrane normal.     Left Ear: Tympanic membrane normal.     Mouth/Throat:     Mouth: Mucous membranes are moist.     Pharynx: Oropharynx is clear.  Eyes:     Pupils: Pupils are equal, round, and reactive to light.  Cardiovascular:     Rate and Rhythm: Normal rate and regular rhythm.     Heart sounds: Normal heart sounds. No murmur heard.    No friction rub. No gallop.  Pulmonary:     Effort: Pulmonary effort is normal. No respiratory distress.     Breath sounds: Normal breath sounds.  Abdominal:     General: Abdomen is flat. Bowel sounds are normal.     Palpations: Abdomen is soft.  Musculoskeletal:        General: No swelling.     Cervical back: Neck supple.  Lymphadenopathy:     Cervical: No cervical adenopathy.  Skin:    General: Skin is warm and dry.  Neurological:     General: No focal deficit present.     Mental Status: He is alert.  Psychiatric:        Mood and Affect: Mood normal.        Behavior: Behavior normal.        Thought Content: Thought content normal.         Assessment & Plan:   Encounter for vaccination -     Tdap vaccine greater than or equal to 7yo IM  Class 2 obesity without serious comorbidity with body mass index (BMI) of 36.0 to 36.9 in adult, unspecified obesity type Assessment & Plan: Labs today (see orders).  Recommend well balanced, high protein diet that is low in sugar and saturated/trans fats. (<2000 calorie per day diet). Also recommend 150+ minutes of moderate physical activity weekly to support healthy lifestyle. Will cont to monitor.  Orders: -     TSH; Future -     Hemoglobin A1c; Future -     Lipid panel; Future -     Comprehensive metabolic panel with GFR; Future -     CBC with Differential/Platelet; Future  Encounter to establish  care Assessment & Plan: Dwyane, 23, presented for routine health maintenance. No current issues with past herniated disc and lumbar fusion. Due for screenings and vaccinations. - Schedule fasting labs:  kidney and liver function, cholesterol panel, A1c, vitamin D, thyroid  function. Will f/u with patient via MyChart with results. - Administer tetanus vaccine today. - Discussed and offered flu vaccine.  - Will hold off on HIV and Hep C screenings due to the patient being self pay. - Plan for next CPE in 1 year.    Return in about 1 year (around 04/10/2025) for Physical.   Saddie JULIANNA Sacks, PA-C

## 2024-04-10 NOTE — Assessment & Plan Note (Signed)
 Labs today (see orders).  Recommend well balanced, high protein diet that is low in sugar and saturated/trans fats. (<2000 calorie per day diet). Also recommend 150+ minutes of moderate physical activity weekly to support healthy lifestyle. Will cont to monitor.

## 2024-04-18 ENCOUNTER — Other Ambulatory Visit: Payer: Self-pay

## 2024-04-18 ENCOUNTER — Ambulatory Visit: Payer: Self-pay

## 2024-04-18 DIAGNOSIS — Z6836 Body mass index (BMI) 36.0-36.9, adult: Secondary | ICD-10-CM

## 2024-04-19 ENCOUNTER — Ambulatory Visit: Payer: Self-pay

## 2024-04-19 LAB — COMPREHENSIVE METABOLIC PANEL WITH GFR
ALT: 25 IU/L (ref 0–44)
AST: 19 IU/L (ref 0–40)
Albumin: 5 g/dL (ref 4.3–5.2)
Alkaline Phosphatase: 80 IU/L (ref 44–121)
BUN/Creatinine Ratio: 18 (ref 9–20)
BUN: 23 mg/dL — ABNORMAL HIGH (ref 6–20)
Bilirubin Total: 1.2 mg/dL (ref 0.0–1.2)
CO2: 21 mmol/L (ref 20–29)
Calcium: 9.8 mg/dL (ref 8.7–10.2)
Chloride: 100 mmol/L (ref 96–106)
Creatinine, Ser: 1.25 mg/dL (ref 0.76–1.27)
Globulin, Total: 2.6 g/dL (ref 1.5–4.5)
Glucose: 79 mg/dL (ref 70–99)
Potassium: 4.4 mmol/L (ref 3.5–5.2)
Sodium: 138 mmol/L (ref 134–144)
Total Protein: 7.6 g/dL (ref 6.0–8.5)
eGFR: 82 mL/min/1.73 (ref >59)

## 2024-04-19 LAB — CBC WITH DIFFERENTIAL/PLATELET
Basophils Absolute: 0.1 x10E3/uL (ref 0.0–0.2)
Basos: 1 %
EOS (ABSOLUTE): 0.2 x10E3/uL (ref 0.0–0.4)
Eos: 2 %
Hematocrit: 46 % (ref 37.5–51.0)
Hemoglobin: 15.2 g/dL (ref 13.0–17.7)
Immature Grans (Abs): 0 x10E3/uL (ref 0.0–0.1)
Immature Granulocytes: 0 %
Lymphocytes Absolute: 2.6 x10E3/uL (ref 0.7–3.1)
Lymphs: 37 %
MCH: 26.3 pg — ABNORMAL LOW (ref 26.6–33.0)
MCHC: 33 g/dL (ref 31.5–35.7)
MCV: 80 fL (ref 79–97)
Monocytes Absolute: 0.5 x10E3/uL (ref 0.1–0.9)
Monocytes: 7 %
Neutrophils Absolute: 3.7 x10E3/uL (ref 1.4–7.0)
Neutrophils: 53 %
Platelets: 304 x10E3/uL (ref 150–450)
RBC: 5.77 x10E6/uL (ref 4.14–5.80)
RDW: 13.5 % (ref 11.6–15.4)
WBC: 7 x10E3/uL (ref 3.4–10.8)

## 2024-04-19 LAB — LIPID PANEL
Chol/HDL Ratio: 4.3 ratio (ref 0.0–5.0)
Cholesterol, Total: 190 mg/dL (ref 100–199)
HDL: 44 mg/dL (ref >39)
LDL Chol Calc (NIH): 129 mg/dL — ABNORMAL HIGH (ref 0–99)
Triglycerides: 95 mg/dL (ref 0–149)
VLDL Cholesterol Cal: 17 mg/dL (ref 5–40)

## 2024-04-19 LAB — TSH: TSH: 1.91 u[IU]/mL (ref 0.450–4.500)

## 2024-04-19 LAB — HEMOGLOBIN A1C
Est. average glucose Bld gHb Est-mCnc: 108 mg/dL
Hgb A1c MFr Bld: 5.4 % (ref 4.8–5.6)

## 2024-04-22 ENCOUNTER — Encounter: Payer: Self-pay | Admitting: Physician Assistant

## 2024-06-04 ENCOUNTER — Emergency Department (HOSPITAL_BASED_OUTPATIENT_CLINIC_OR_DEPARTMENT_OTHER)
Admission: EM | Admit: 2024-06-04 | Discharge: 2024-06-04 | Disposition: A | Discharge status: Home / Self Care | Payer: Self-pay | Attending: Emergency Medicine | Admitting: Emergency Medicine

## 2024-06-04 ENCOUNTER — Encounter (HOSPITAL_BASED_OUTPATIENT_CLINIC_OR_DEPARTMENT_OTHER): Payer: Self-pay

## 2024-06-04 ENCOUNTER — Emergency Department (HOSPITAL_BASED_OUTPATIENT_CLINIC_OR_DEPARTMENT_OTHER): Payer: Self-pay

## 2024-06-04 ENCOUNTER — Other Ambulatory Visit: Payer: Self-pay

## 2024-06-04 DIAGNOSIS — Y990 Civilian activity done for income or pay: Secondary | ICD-10-CM | POA: Insufficient documentation

## 2024-06-04 DIAGNOSIS — S0990XA Unspecified injury of head, initial encounter: Secondary | ICD-10-CM

## 2024-06-04 DIAGNOSIS — S0101XA Laceration without foreign body of scalp, initial encounter: Secondary | ICD-10-CM | POA: Insufficient documentation

## 2024-06-04 DIAGNOSIS — W228XXA Striking against or struck by other objects, initial encounter: Secondary | ICD-10-CM | POA: Insufficient documentation

## 2024-06-04 MED ORDER — LIDOCAINE-EPINEPHRINE (PF) 2 %-1:200000 IJ SOLN
10.0000 mL | Freq: Once | INTRAMUSCULAR | Status: AC
  Administered 2024-06-04: 10 mL via INTRADERMAL
  Filled 2024-06-04: qty 20

## 2024-06-04 MED ORDER — ACETAMINOPHEN 500 MG PO TABS
1000.0000 mg | ORAL_TABLET | Freq: Once | ORAL | Status: AC
  Administered 2024-06-04: 1000 mg via ORAL
  Filled 2024-06-04: qty 2

## 2024-06-04 NOTE — ED Provider Notes (Signed)
 [place] [facility] Provider Note   CSN: [id number] Arrival date & time: 06/04/24  9180     Patient presents with: Head Injury   Jesse Knox is a 23 y.o. male.   Patient was at work this morning, operating a fork lift. His seat belt came undone causing the unit to shut off suddenly and he was thrown forward, hitting his head on part of the metal arm of the unit. He reports a scalp laceration, frontal scalp. No LOC. He denies neck pain. No other injury. He reports headache but no nausea, vomiting, visual changes. Tetanus is UTD.  The history is provided by the patient. No language interpreter was used.  Head Injury      Prior to Admission medications   Not on File    Allergies: Patient has no known allergies.    Review of Systems  Updated Vital Signs BP 120/72 (BP Location: Right Arm)   Pulse 68   Temp 97.6 F (36.4 C) (Oral)   Resp 18   Ht 6' (1.829 m)   Wt 117.9 kg   SpO2 95%   BMI 35.26 kg/m   Physical Exam Vitals and nursing note reviewed.  Constitutional:      Appearance: He is well-developed.  HENT:     Head:     Comments: 3 cm linear laceration frontal scalp.     Right Ear: Tympanic membrane normal.     Left Ear: Tympanic membrane normal.     Ears:     Comments: No hemotympanum Eyes:     Extraocular Movements: Extraocular movements intact.     Pupils: Pupils are equal, round, and reactive to light.  Neck:     Comments: No midline cervical tenderness.  Cardiovascular:     Rate and Rhythm: Normal rate.  Pulmonary:     Effort: Pulmonary effort is normal.  Chest:     Chest wall: No tenderness.  Abdominal:     Palpations: Abdomen is soft.     Tenderness: There is no abdominal tenderness.  Musculoskeletal:        General: Normal range of motion.     Cervical back: Normal range of motion.  Skin:    General: Skin is warm and dry.  Neurological:     Mental Status: He is alert and oriented to person, place,  and time.     GCS: GCS eye subscore is 4. GCS verbal subscore is 5. GCS motor subscore is 6.     Cranial Nerves: No cranial nerve deficit, dysarthria or facial asymmetry.     Sensory: Sensation is intact. No sensory deficit.     Motor: Motor function is intact. No weakness or pronator drift.     Coordination: Finger-Nose-Finger Test normal.     Gait: Gait is intact.     Deep Tendon Reflexes:     Reflex Scores:      Patellar reflexes are 2+ on the right side and 2+ on the left side.    (all labs ordered are listed, but only abnormal results are displayed) Labs Reviewed - No data to display  EKG: None  Radiology: CT Head Wo Contrast Result Date: 06/04/2024 EXAM: CT HEAD WITHOUT CONTRAST 06/04/2024 10:23:00 AM TECHNIQUE: CT of the head was performed without the administration of intravenous contrast. Automated exposure control, iterative reconstruction, and/or weight-based adjustment of the mA/kV was utilized to reduce the radiation dose to as low as reasonably achievable. COMPARISON: None available. CLINICAL HISTORY: Head trauma,  moderate-severe. Hit top of head today on piece of equipment. Laceration, headache, dizziness. FINDINGS: BRAIN AND VENTRICLES: No acute hemorrhage. No evidence of acute infarct. No hydrocephalus. No extra-axial collection. No mass effect or midline shift. ORBITS: No acute abnormality. SINUSES: No acute abnormality. SOFT TISSUES AND SKULL: Skin staples at the vertex. No acute soft tissue abnormality. No skull fracture. IMPRESSION: 1. No acute intracranial abnormality related to head trauma. Electronically signed by: Donnice Mania MD 06/04/2024 10:59 AM EDT RP Workstation: [id number]     .Laceration Repair  Date/Time: 06/04/2024 11:07 AM  Performed by: Odell Balls, PA-C Authorized by: Odell Balls, PA-C   Consent:    Consent obtained:  Verbal Universal protocol:    Procedure explained and questions answered to patient or proxy's satisfaction: yes      Patient identity confirmed:  Verbally with patient Anesthesia:    Anesthesia method:  Local infiltration   Local anesthetic:  Lidocaine 2% WITH epi Laceration details:    Location:  Scalp   Scalp location:  Frontal   Length (cm):  3 Exploration:    Hemostasis achieved with:  Direct pressure   Contaminated: no   Treatment:    Area cleansed with:  Saline   Amount of cleaning:  Standard Skin repair:    Repair method:  Staples   Number of staples:  3 Approximation:    Approximation:  Close Repair type:    Repair type:  Simple    Medications Ordered in the ED  lidocaine-EPINEPHrine (XYLOCAINE W/EPI) 2 %-1:200000 (PF) injection 10 mL (10 mLs Intradermal Given by Other 06/04/24 0936)  acetaminophen  (TYLENOL ) tablet 1,000 mg (1,000 mg Oral Given 06/04/24 0935)    Clinical Course as of 06/04/24 1110  Tue Jun 04, 2024  0959 Patient to ED with unwitnessed head injury causing frontal scalp laceration as detailed in the HPI. Neuro exam without deficits. [place] head injury rules reviewed and are c/w low risk. Shared decision making with the patient. Discussed concussion vs intracranial head injury, and low risk for latter. He states he would like the CT done. Will order the study. [SU]  1106 CT head is negative for intracranial injury. Laceration to scalp repaired. He is appropriate for discharge home. Discussed return precautions.  [SU]    Clinical Course User Index [SU] Odell Balls, PA-C                                 Medical Decision Making       Final diagnoses:  Minor head injury, initial encounter  Laceration of scalp, initial encounter    ED Discharge Orders     None          Odell Balls RIGGERS 06/04/24 1110    Yolande Lamar BROCKS, MD 06/10/24 1425

## 2024-06-04 NOTE — Discharge Instructions (Addendum)
 As we discussed, your head CT is negative for internal injury. The staples will need to be removed in 10 days. This can be done by your primary care provider. If there is any sign of infection - increasing pain or redness, swelling, purulent drainage, fever - do to be seen sooner.   Tylenol  and/or ibuprofen for soreness and any headaches. Return to th eED as needed.

## 2024-06-04 NOTE — ED Notes (Signed)
 Patient transported to CT

## 2024-06-04 NOTE — ED Triage Notes (Addendum)
 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 . States that he has a laceration in the front of his head and that he has a headache. Denies LOC. Denies dizziness, nausea, vomiting or light sensitivity.

## 2024-06-13 ENCOUNTER — Ambulatory Visit: Payer: Self-pay | Admitting: Physician Assistant

## 2024-06-13 NOTE — Progress Notes (Deleted)
 06/13/2024 Jesse Knox [id number] 2001/05/04  Referring provider: Gayle Saddie FALCON, PA-C Primary GI doctor: {acdocs:[id number]}  ASSESSMENT AND PLAN:  Hemorrhoids 04/18/2024 Hgb 15.2, MCV 80, MCH 26.3 slightly decreased  Obesity  Patient Care Team: Gayle Saddie FALCON DEVONNA as PCP - General (Physician Assistant)  HISTORY OF PRESENT ILLNESS: 23 y.o. male with a past medical history listed below presents for evaluation of ***.   *** Discussed the use of AI scribe software for clinical note transcription with the patient, who gave verbal consent to proceed.  History of Present Illness            He  reports that he quit smoking about 4 years ago. His smoking use included cigarettes. He uses smokeless tobacco. He reports current alcohol use. He reports that he does not use drugs.  RELEVANT GI HISTORY, IMAGING AND LABS: Results          CBC    Component Value Date/Time   WBC 7.0 04/18/2024 0916   WBC 5.9 03/11/2021 1013   RBC 5.77 04/18/2024 0916   RBC 5.30 03/11/2021 1013   HGB 15.2 04/18/2024 0916   HCT 46.0 04/18/2024 0916   PLT 304 04/18/2024 0916   MCV 80 04/18/2024 0916   MCH 26.3 (L) 04/18/2024 0916   MCH 27.7 03/11/2021 1013   MCHC 33.0 04/18/2024 0916   MCHC 34.2 03/11/2021 1013   RDW 13.5 04/18/2024 0916   LYMPHSABS 2.6 04/18/2024 0916   MONOABS 0.7 03/11/2021 1013   EOSABS 0.2 04/18/2024 0916   BASOSABS 0.1 04/18/2024 0916   Recent Labs    04/18/24 0916  HGB 15.2    CMP     Component Value Date/Time   NA 138 04/18/2024 0916   K 4.4 04/18/2024 0916   CL 100 04/18/2024 0916   CO2 21 04/18/2024 0916   GLUCOSE 79 04/18/2024 0916   GLUCOSE 97 03/11/2021 1013   BUN 23 (H) 04/18/2024 0916   CREATININE 1.25 04/18/2024 0916   CALCIUM 9.8 04/18/2024 0916   PROT 7.6 04/18/2024 0916   ALBUMIN 5.0 04/18/2024 0916   AST 19 04/18/2024 0916   ALT 25 04/18/2024 0916   ALKPHOS 80 04/18/2024 0916   BILITOT 1.2 04/18/2024 0916   GFRNONAA >60 03/11/2021 1013    GFRAA 96 04/18/2018 1000      Latest Ref Rng & Units 04/18/2024    9:16 AM 06/21/2019   10:48 AM  Hepatic Function  Total Protein 6.0 - 8.5 g/dL 7.6  7.3   Albumin 4.3 - 5.2 g/dL 5.0  4.6   AST 0 - 40 IU/L 19  21   ALT 0 - 44 IU/L 25  25   Alk Phosphatase 44 - 121 IU/L 80  58   Total Bilirubin 0.0 - 1.2 mg/dL 1.2  0.8       Current Medications:  No current outpatient medications on file.  Medical History:  Past Medical History:  Diagnosis Date   Humerus fracture    Obese    Allergies: No Known Allergies   Surgical History:  He  has a past surgical history that includes Tympanostomy tube placement; Lumbar laminectomy; Hemilaminotomy lumbar spine; and Lumbar fusion. Family History:  His family history includes Breast cancer in his maternal grandmother; Heart attack in his maternal uncle.  REVIEW OF SYSTEMS  : All other systems reviewed and negative except where noted in the History of Present Illness.  PHYSICAL EXAM: There were no vitals taken for this visit. Physical  Exam          Alan JONELLE Coombs, PA-C 7:52 AM
